# Patient Record
Sex: Female | Born: 1995 | Race: White | Hispanic: No | Smoking: Never smoker
Health system: Southern US, Community
[De-identification: ages and names within clinical notes are randomized; demographics above are authoritative.]

## PROBLEM LIST (undated history)

## (undated) ENCOUNTER — Inpatient Hospital Stay: Payer: Self-pay

## (undated) ENCOUNTER — Ambulatory Visit: Admission: EM | Payer: Commercial Managed Care - PPO

## (undated) DIAGNOSIS — R102 Pelvic and perineal pain: Secondary | ICD-10-CM

## (undated) DIAGNOSIS — E282 Polycystic ovarian syndrome: Secondary | ICD-10-CM

## (undated) DIAGNOSIS — Z8619 Personal history of other infectious and parasitic diseases: Secondary | ICD-10-CM

## (undated) DIAGNOSIS — R635 Abnormal weight gain: Secondary | ICD-10-CM

## (undated) DIAGNOSIS — Z789 Other specified health status: Secondary | ICD-10-CM

## (undated) DIAGNOSIS — A749 Chlamydial infection, unspecified: Secondary | ICD-10-CM

## (undated) DIAGNOSIS — N926 Irregular menstruation, unspecified: Secondary | ICD-10-CM

## (undated) DIAGNOSIS — N944 Primary dysmenorrhea: Secondary | ICD-10-CM

## (undated) DIAGNOSIS — A64 Unspecified sexually transmitted disease: Secondary | ICD-10-CM

## (undated) HISTORY — DX: Chlamydial infection, unspecified: A74.9

## (undated) HISTORY — DX: Abnormal weight gain: R63.5

## (undated) HISTORY — DX: Unspecified sexually transmitted disease: A64

## (undated) HISTORY — DX: Primary dysmenorrhea: N94.4

## (undated) HISTORY — DX: Irregular menstruation, unspecified: N92.6

## (undated) HISTORY — PX: NO PAST SURGERIES: SHX2092

## (undated) HISTORY — DX: Pelvic and perineal pain: R10.2

## (undated) HISTORY — DX: Personal history of other infectious and parasitic diseases: Z86.19

---
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
## 2016-03-07 ENCOUNTER — Other Ambulatory Visit: Payer: Self-pay | Admitting: *Deleted

## 2016-03-07 DIAGNOSIS — Z20828 Contact with and (suspected) exposure to other viral communicable diseases: Secondary | ICD-10-CM

## 2016-03-08 ENCOUNTER — Other Ambulatory Visit: Payer: PRIVATE HEALTH INSURANCE

## 2016-04-03 ENCOUNTER — Other Ambulatory Visit: Payer: Self-pay

## 2016-04-11 ENCOUNTER — Ambulatory Visit: Payer: PRIVATE HEALTH INSURANCE | Admitting: Obstetrics and Gynecology

## 2016-06-26 ENCOUNTER — Other Ambulatory Visit: Payer: Self-pay | Admitting: Obstetrics and Gynecology

## 2016-06-26 MED ORDER — FOLIC ACID 1 MG PO TABS: 1.0000 mg | ORAL_TABLET | Freq: Every day | ORAL | 4 refills | Status: DC

## 2016-10-19 ENCOUNTER — Encounter: Payer: Self-pay | Admitting: Obstetrics and Gynecology

## 2016-12-26 ENCOUNTER — Other Ambulatory Visit: Payer: Self-pay | Admitting: Obstetrics and Gynecology

## 2016-12-26 ENCOUNTER — Encounter: Payer: Self-pay | Admitting: Obstetrics and Gynecology

## 2016-12-26 ENCOUNTER — Ambulatory Visit (INDEPENDENT_AMBULATORY_CARE_PROVIDER_SITE_OTHER): Payer: PRIVATE HEALTH INSURANCE | Admitting: Obstetrics and Gynecology

## 2016-12-26 VITALS — BP 120/78 | HR 98 | Ht 66.0 in | Wt 150.2 lb

## 2016-12-26 DIAGNOSIS — Z01419 Encounter for gynecological examination (general) (routine) without abnormal findings: Secondary | ICD-10-CM | POA: Diagnosis not present

## 2016-12-26 NOTE — Patient Instructions (Signed)
 Preventive Care 18-39 Years, Female Preventive care refers to lifestyle choices and visits with your health care provider that can promote health and wellness. What does preventive care include?  A yearly physical exam. This is also called an annual well check.  Dental exams once or twice a year.  Routine eye exams. Ask your health care provider how often you should have your eyes checked.  Personal lifestyle choices, including:  Daily care of your teeth and gums.  Regular physical activity.  Eating a healthy diet.  Avoiding tobacco and drug use.  Limiting alcohol use.  Practicing safe sex.  Taking vitamin and mineral supplements as recommended by your health care provider. What happens during an annual well check? The services and screenings done by your health care provider during your annual well check will depend on your age, overall health, lifestyle risk factors, and family history of disease. Counseling  Your health care provider may ask you questions about your:  Alcohol use.  Tobacco use.  Drug use.  Emotional well-being.  Home and relationship well-being.  Sexual activity.  Eating habits.  Work and work environment.  Method of birth control.  Menstrual cycle.  Pregnancy history. Screening  You may have the following tests or measurements:  Height, weight, and BMI.  Diabetes screening. This is done by checking your blood sugar (glucose) after you have not eaten for a while (fasting).  Blood pressure.  Lipid and cholesterol levels. These may be checked every 5 years starting at age 20.  Skin check.  Hepatitis C blood test.  Hepatitis B blood test.  Sexually transmitted disease (STD) testing.  BRCA-related cancer screening. This may be done if you have a family history of breast, ovarian, tubal, or peritoneal cancers.  Pelvic exam and Pap test. This may be done every 3 years starting at age 21. Starting at age 30, this may be done  every 5 years if you have a Pap test in combination with an HPV test. Discuss your test results, treatment options, and if necessary, the need for more tests with your health care provider. Vaccines  Your health care provider may recommend certain vaccines, such as:  Influenza vaccine. This is recommended every year.  Tetanus, diphtheria, and acellular pertussis (Tdap, Td) vaccine. You may need a Td booster every 10 years.  Varicella vaccine. You may need this if you have not been vaccinated.  HPV vaccine. If you are 26 or younger, you may need three doses over 6 months.  Measles, mumps, and rubella (MMR) vaccine. You may need at least one dose of MMR. You may also need a second dose.  Pneumococcal 13-valent conjugate (PCV13) vaccine. You may need this if you have certain conditions and were not previously vaccinated.  Pneumococcal polysaccharide (PPSV23) vaccine. You may need one or two doses if you smoke cigarettes or if you have certain conditions.  Meningococcal vaccine. One dose is recommended if you are age 19-21 years and a first-year college student living in a residence hall, or if you have one of several medical conditions. You may also need additional booster doses.  Hepatitis A vaccine. You may need this if you have certain conditions or if you travel or work in places where you may be exposed to hepatitis A.  Hepatitis B vaccine. You may need this if you have certain conditions or if you travel or work in places where you may be exposed to hepatitis B.  Haemophilus influenzae type b (Hib) vaccine. You may need   this if you have certain risk factors. Talk to your health care provider about which screenings and vaccines you need and how often you need them. This information is not intended to replace advice given to you by your health care provider. Make sure you discuss any questions you have with your health care provider. Document Released: 12/04/2001 Document Revised:  06/27/2016 Document Reviewed: 08/09/2015 Elsevier Interactive Patient Education  2017 Elsevier Inc.  

## 2016-12-26 NOTE — Progress Notes (Signed)
   Subjective:     Stephanie May is a 21 y.o. female and is here for a comprehensive physical exam. The patient reports no problems. Single white female. Currently in school full time. In monogamous relationship with female partner.   Social History   Social History  . Marital status: Single    Spouse name: N/A  . Number of children: N/A  . Years of education: N/A   Occupational History  . Not on file.   Social History Main Topics  . Smoking status: Never Smoker  . Smokeless tobacco: Never Used  . Alcohol use No  . Drug use: No  . Sexual activity: Yes    Birth control/ protection: Condom   Other Topics Concern  . Not on file   Social History Narrative   ** Merged History Encounter **       Health Maintenance  Topic Date Due  . Janet BerlinETANUS/TDAP  02/06/2016  . INFLUENZA VACCINE  05/22/2016  . CHLAMYDIA SCREENING  12/05/2016  . HIV Screening  Completed    The following portions of the patient's history were reviewed and updated as appropriate: allergies, current medications, past family history, past medical history, past social history, past surgical history and problem list.  Review of Systems Pertinent items noted in HPI and remainder of comprehensive ROS otherwise negative.   Objective:    General appearance: alert, cooperative and appears stated age Neck: no adenopathy, no carotid bruit, no JVD, supple, symmetrical, trachea midline and thyroid not enlarged, symmetric, no tenderness/mass/nodules Lungs: clear to auscultation bilaterally Breasts: normal appearance, no masses or tenderness Heart: regular rate and rhythm, S1, S2 normal, no murmur, click, rub or gallop Abdomen: soft, non-tender; bowel sounds normal; no masses,  no organomegaly Pelvic: cervix normal in appearance, external genitalia normal, no adnexal masses or tenderness, no cervical motion tenderness, rectovaginal septum normal, uterus normal size, shape, and consistency and vagina normal without  discharge    Assessment:    Healthy female exam.      Plan:  RTC 1 year.  Melody Aura CampsShambley, CNM   See After Visit Summary for Counseling Recommendations

## 2017-03-08 ENCOUNTER — Other Ambulatory Visit: Payer: PRIVATE HEALTH INSURANCE

## 2017-03-08 ENCOUNTER — Other Ambulatory Visit: Payer: Self-pay | Admitting: Obstetrics and Gynecology

## 2017-03-08 DIAGNOSIS — N926 Irregular menstruation, unspecified: Secondary | ICD-10-CM

## 2017-03-08 LAB — BETA HCG QUANT (REF LAB): hCG Quant: 1798 m[IU]/mL

## 2017-03-14 ENCOUNTER — Other Ambulatory Visit: Payer: Self-pay | Admitting: Obstetrics and Gynecology

## 2017-03-14 ENCOUNTER — Encounter: Payer: Self-pay | Admitting: Obstetrics and Gynecology

## 2017-03-14 ENCOUNTER — Ambulatory Visit (INDEPENDENT_AMBULATORY_CARE_PROVIDER_SITE_OTHER): Payer: PRIVATE HEALTH INSURANCE

## 2017-03-14 DIAGNOSIS — N926 Irregular menstruation, unspecified: Secondary | ICD-10-CM | POA: Diagnosis not present

## 2017-03-23 ENCOUNTER — Encounter: Payer: Self-pay | Admitting: Obstetrics and Gynecology

## 2017-03-29 ENCOUNTER — Ambulatory Visit (INDEPENDENT_AMBULATORY_CARE_PROVIDER_SITE_OTHER): Payer: PRIVATE HEALTH INSURANCE | Admitting: Obstetrics and Gynecology

## 2017-03-29 VITALS — BP 112/60 | HR 64 | Ht 66.0 in | Wt 146.4 lb

## 2017-03-29 DIAGNOSIS — Z3401 Encounter for supervision of normal first pregnancy, first trimester: Secondary | ICD-10-CM

## 2017-03-29 DIAGNOSIS — Z113 Encounter for screening for infections with a predominantly sexual mode of transmission: Secondary | ICD-10-CM

## 2017-03-29 DIAGNOSIS — Z1389 Encounter for screening for other disorder: Secondary | ICD-10-CM

## 2017-03-29 NOTE — Patient Instructions (Signed)
Pregnancy and Zika Virus Disease Zika virus disease, or Zika, is an illness that can spread to people from mosquitoes that carry the virus. It may also spread from person to person through infected body fluids. Zika first occurred in [place], but recently it has spread to new areas. The virus occurs in tropical climates. The location of Zika continues to change. Most people who become infected with Zika virus do not develop serious illness. However, Zika may cause birth defects in an unborn baby whose mother is infected with the virus. It may also increase the risk of miscarriage. What are the symptoms of Zika virus disease? In many cases, people who have been infected with Zika virus do not develop any symptoms. If symptoms appear, they usually start about a week after the person is infected. Symptoms are usually mild. They may include:  Fever.  Rash.  Red eyes.  Joint pain.  How does Zika virus disease spread? The main way that Zika virus spreads is through the bite of a certain type of mosquito. Unlike most types of mosquitos, which bite only at night, the type of mosquito that carries Zika virus bites both at night and during the day. Zika virus can also spread through sexual contact, through a blood transfusion, and from a mother to her baby before or during birth. Once you have had Zika virus disease, it is unlikely that you will get it again. Can I pass Zika to my baby during pregnancy? Yes, Zika can pass from a mother to her baby before or during birth. What problems can Zika cause for my baby? A woman who is infected with Zika virus while pregnant is at risk of having her baby born with a condition in which the brain or head is smaller than expected (microcephaly). Babies who have microcephaly can have developmental delays, seizures, hearing problems, and vision problems. Having Zika virus disease during pregnancy can also increase the risk of miscarriage. How can Zika virus disease be  prevented? There is no vaccine to prevent Zika. The best way to prevent the disease is to avoid infected mosquitoes and avoid exposure to body fluids that can spread the virus. Avoid any possible exposure to Zika by taking the following precautions. For women and their sex partners:  Avoid traveling to high-risk areas. The locations where Zika is being reported change often. To identify high-risk areas, check the CDC travel website: www.cdc.gov/zika/geo/index.html  If you or your sex partner must travel to a high-risk area, talk with a health care provider before and after traveling.  Take all precautions to avoid mosquito bites if you live in, or travel to, any of the high-risk areas. Insect repellents are safe to use during pregnancy.  Ask your health care provider when it is safe to have sexual contact.  For women:  If you are pregnant or trying to become pregnant, avoid sexual contact with persons who may have been exposed to Zika virus, persons who have possible symptoms of Zika, or persons whose history you are unsure about. If you choose to have sexual contact with someone who may have been exposed to Zika virus, use condoms correctly during the entire duration of sexual activity, every time. Do not share sexual devices, as you may be exposed to body fluids.  Ask your health care provider about when it is safe to attempt pregnancy after a possible exposure to Zika virus.  What steps should I take to avoid mosquito bites? Take these steps to avoid mosquito bites   when you are in a high-risk area:  Wear loose clothing that covers your arms and legs.  Limit your outdoor activities.  Do not open windows unless they have window screens.  Sleep under mosquito nets.  Use insect repellent. The best insect repellents have:  DEET, picaridin, oil of lemon eucalyptus (OLE), or IR3535 in them.  Higher amounts of an active ingredient in them.  Remember that insect repellents are safe to  use during pregnancy.  Do not use OLE on children who are younger than 3 years of age. Do not use insect repellent on babies who are younger than 2 months of age.  Cover your child's stroller with mosquito netting. Make sure the netting fits snugly and that any loose netting does not cover your child's mouth or nose. Do not use a blanket as a mosquito-protection cover.  Do not apply insect repellent underneath clothing.  If you are using sunscreen, apply the sunscreen before applying the insect repellent.  Treat clothing with permethrin. Do not apply permethrin directly to your skin. Follow label directions for safe use.  Get rid of standing water, where mosquitoes may reproduce. Standing water is often found in items such as buckets, bowls, animal food dishes, and flowerpots.  When you return from traveling to any high-risk area, continue taking actions to protect yourself against mosquito bites for 3 weeks, even if you show no signs of illness. This will prevent spreading Zika virus to uninfected mosquitoes. What should I know about the sexual transmission of Zika? People can spread Zika to their sexual partners during vaginal, anal, or oral sex, or by sharing sexual devices. Many people with Zika do not develop symptoms, so a person could spread the disease without knowing that they are infected. The greatest risk is to women who are pregnant or who may become pregnant. Zika virus can live longer in semen than it can live in blood. Couples can prevent sexual transmission of the virus by:  Using condoms correctly during the entire duration of sexual activity, every time. This includes vaginal, anal, and oral sex.  Not sharing sexual devices. Sharing increases your risk of being exposed to body fluid from another person.  Avoiding all sexual activity until your health care provider says it is safe.  Should I be tested for Zika virus? A sample of your blood can be tested for Zika virus. A  pregnant woman should be tested if she may have been exposed to the virus or if she has symptoms of Zika. She may also have additional tests done during her pregnancy, such ultrasound testing. Talk with your health care provider about which tests are recommended. This information is not intended to replace advice given to you by your health care provider. Make sure you discuss any questions you have with your health care provider. Document Released: 06/29/2015 Document Revised: 03/15/2016 Document Reviewed: 06/22/2015 Elsevier Interactive Patient Education  2018 [employer] Hyperemesis Gravidarum Hyperemesis gravidarum is a severe form of nausea and vomiting that happens during pregnancy. Hyperemesis is worse than morning sickness. It may cause you to have nausea or vomiting all day for many days. It may keep you from eating and drinking enough food and liquids. Hyperemesis usually occurs during the first half (the first 20 weeks) of pregnancy. It often goes away once a woman is in her second half of pregnancy. However, sometimes hyperemesis continues through an entire pregnancy. What are the causes? The cause of this condition is not known. It may be related   to changes in chemicals (hormones) in the body during pregnancy, such as the high level of pregnancy hormone (human chorionic gonadotropin) or the increase in the female sex hormone (estrogen). What are the signs or symptoms? Symptoms of this condition include:  Severe nausea and vomiting.  Nausea that does not go away.  Vomiting that does not allow you to keep any food down.  Weight loss.  Body fluid loss (dehydration).  Having no desire to eat, or not liking food that you have previously enjoyed.  How is this diagnosed? This condition may be diagnosed based on:  A physical exam.  Your medical history.  Your symptoms.  Blood tests.  Urine tests.  How is this treated? This condition may be managed with medicine. If  medicines to do not help relieve nausea and vomiting, you may need to receive fluids through an IV tube at the hospital. Follow these instructions at home:  Take over-the-counter and prescription medicines only as told by your health care provider.  Avoid iron pills and multivitamins that contain iron for the first 3-4 months of pregnancy. If you take prescription iron pills, do not stop taking them unless your health care provider approves.  Take the following actions to help prevent nausea and vomiting: ? In the morning, before getting out of bed, try eating a couple of dry crackers or a piece of toast. ? Avoid foods and smells that upset your stomach. Fatty and spicy foods may make nausea worse. ? Eat 5-6 small meals a day. ? Do not drink fluids while eating meals. Drink between meals. ? Eat or suck on things that have ginger in them. Ginger can help relieve nausea. ? Avoid food preparation. The smell of food can spoil your appetite or trigger nausea.  Follow instructions from your health care provider about eating or drinking restrictions.  For snacks, eat high-protein foods, such as cheese.  Keep all follow-up and pre-birth (prenatal) visits as told by your health care provider. This is important. Contact a health care provider if:  You have pain in your abdomen.  You have a severe headache.  You have vision problems.  You are losing weight. Get help right away if:  You cannot drink fluids without vomiting.  You vomit blood.  You have constant nausea and vomiting.  You are very weak.  You are very thirsty.  You feel dizzy.  You faint.  You have a fever or other symptoms that last for more than 2-3 days.  You have a fever and your symptoms suddenly get worse. Summary  Hyperemesis gravidarum is a severe form of nausea and vomiting that happens during pregnancy.  Making some changes to your eating habits may help relieve nausea and vomiting.  This condition may  be managed with medicine.  If medicines to do not help relieve nausea and vomiting, you may need to receive fluids through an IV tube at the hospital. This information is not intended to replace advice given to you by your health care provider. Make sure you discuss any questions you have with your health care provider. Document Released: 10/08/2005 Document Revised: 06/06/2016 Document Reviewed: 06/06/2016 Elsevier Interactive Patient Education  2017 [employer] First Trimester of Pregnancy The first trimester of pregnancy is from week 1 until the end of week 13 (months 1 through 3). During this time, your baby will begin to develop inside you. At 6-8 weeks, the eyes and face are formed, and the heartbeat can be seen on ultrasound. At the   end of 12 weeks, all the baby's organs are formed. Prenatal care is all the medical care you receive before the birth of your baby. Make sure you get good prenatal care and follow all of your doctor's instructions. Follow these instructions at home: Medicines  Take over-the-counter and prescription medicines only as told by your doctor. Some medicines are safe and some medicines are not safe during pregnancy.  Take a prenatal vitamin that contains at least 600 micrograms (mcg) of folic acid.  If you have trouble pooping (constipation), take medicine that will make your stool soft (stool softener) if your doctor approves. Eating and drinking  Eat regular, healthy meals.  Your doctor will tell you the amount of weight gain that is right for you.  Avoid raw meat and uncooked cheese.  If you feel sick to your stomach (nauseous) or throw up (vomit): ? Eat 4 or 5 small meals a day instead of 3 large meals. ? Try eating a few soda crackers. ? Drink liquids between meals instead of during meals.  To prevent constipation: ? Eat foods that are high in fiber, like fresh fruits and vegetables, whole grains, and beans. ? Drink enough fluids to keep your pee  (urine) clear or pale yellow. Activity  Exercise only as told by your doctor. Stop exercising if you have cramps or pain in your lower belly (abdomen) or low back.  Do not exercise if it is too hot, too humid, or if you are in a place of great height (high altitude).  Try to avoid standing for long periods of time. Move your legs often if you must stand in one place for a long time.  Avoid heavy lifting.  Wear low-heeled shoes. Sit and stand up straight.  You can have sex unless your doctor tells you not to. Relieving pain and discomfort  Wear a good support bra if your breasts are sore.  Take warm water baths (sitz baths) to soothe pain or discomfort caused by hemorrhoids. Use hemorrhoid cream if your doctor says it is okay.  Rest with your legs raised if you have leg cramps or low back pain.  If you have puffy, bulging veins (varicose veins) in your legs: ? Wear support hose or compression stockings as told by your doctor. ? Raise (elevate) your feet for 15 minutes, 3-4 times a day. ? Limit salt in your food. Prenatal care  Schedule your prenatal visits by the twelfth week of pregnancy.  Write down your questions. Take them to your prenatal visits.  Keep all your prenatal visits as told by your doctor. This is important. Safety  Wear your seat belt at all times when driving.  Make a list of emergency phone numbers. The list should include numbers for family, friends, the hospital, and police and fire departments. General instructions  Ask your doctor for a referral to a local prenatal class. Begin classes no later than at the start of month 6 of your pregnancy.  Ask for help if you need counseling or if you need help with nutrition. Your doctor can give you advice or tell you where to go for help.  Do not use hot tubs, steam rooms, or saunas.  Do not douche or use tampons or scented sanitary pads.  Do not cross your legs for long periods of time.  Avoid all herbs  and alcohol. Avoid drugs that are not approved by your doctor.  Do not use any tobacco products, including cigarettes, chewing tobacco, and electronic cigarettes.   If you need help quitting, ask your doctor. You may get counseling or other support to help you quit.  Avoid cat litter boxes and soil used by cats. These carry germs that can cause birth defects in the baby and can cause a loss of your baby (miscarriage) or stillbirth.  Visit your dentist. At home, brush your teeth with a soft toothbrush. Be gentle when you floss. Contact a doctor if:  You are dizzy.  You have mild cramps or pressure in your lower belly.  You have a nagging pain in your belly area.  You continue to feel sick to your stomach, you throw up, or you have watery poop (diarrhea).  You have a bad smelling fluid coming from your vagina.  You have pain when you pee (urinate).  You have increased puffiness (swelling) in your face, hands, legs, or ankles. Get help right away if:  You have a fever.  You are leaking fluid from your vagina.  You have spotting or bleeding from your vagina.  You have very bad belly cramping or pain.  You gain or lose weight rapidly.  You throw up blood. It may look like coffee grounds.  You are around people who have German measles, fifth disease, or chickenpox.  You have a very bad headache.  You have shortness of breath.  You have any kind of trauma, such as from a fall or a car accident. Summary  The first trimester of pregnancy is from week 1 until the end of week 13 (months 1 through 3).  To take care of yourself and your unborn baby, you will need to eat healthy meals, take medicines only if your doctor tells you to do so, and do activities that are safe for you and your baby.  Keep all follow-up visits as told by your doctor. This is important as your doctor will have to ensure that your baby is healthy and growing well. This information is not intended to replace  advice given to you by your health care provider. Make sure you discuss any questions you have with your health care provider. Document Released: 03/26/2008 Document Revised: 10/16/2016 Document Reviewed: 10/16/2016 Elsevier Interactive Patient Education  2017 [employer] Commonly Asked Questions During Pregnancy  Cats: A parasite can be excreted in cat feces.  To avoid exposure you need to have another person empty the little box.  If you must empty the litter box you will need to wear gloves.  Wash your hands after handling your cat.  This parasite can also be found in raw or undercooked meat so this should also be avoided.  Colds, Sore Throats, Flu: Please check your medication sheet to see what you can take for symptoms.  If your symptoms are unrelieved by these medications please call the office.  Dental Work: Most any dental work your dentist recommends is permitted.  X-rays should only be taken during the first trimester if absolutely necessary.  Your abdomen should be shielded with a lead apron during all x-rays.  Please notify your provider prior to receiving any x-rays.  Novocaine is fine; gas is not recommended.  If your dentist requires a note from us prior to dental work please call the office and we will provide one for you.  Exercise: Exercise is an important part of staying healthy during your pregnancy.  You may continue most exercises you were accustomed to prior to pregnancy.  Later in your pregnancy you will most likely notice you have difficulty with activities   requiring balance like riding a bicycle.  It is important that you listen to your body and avoid activities that put you at a higher risk of falling.  Adequate rest and staying well hydrated are a must!  If you have questions about the safety of specific activities ask your provider.    Exposure to Children with illness: Try to avoid obvious exposure; report any symptoms to us when noted,  If you have chicken pos, red  measles or mumps, you should be immune to these diseases.   Please do not take any vaccines while pregnant unless you have checked with your OB provider.  Fetal Movement: After 28 weeks we recommend you do "kick counts" twice daily.  Lie or sit down in a calm quiet environment and count your baby movements "kicks".  You should feel your baby at least 10 times per hour.  If you have not felt 10 kicks within the first hour get up, walk around and have something sweet to eat or drink then repeat for an additional hour.  If count remains less than 10 per hour notify your provider.  Fumigating: Follow your pest control agent's advice as to how long to stay out of your home.  Ventilate the area well before re-entering.  Hemorrhoids:   Most over-the-counter preparations can be used during pregnancy.  Check your medication to see what is safe to use.  It is important to use a stool softener or fiber in your diet and to drink lots of liquids.  If hemorrhoids seem to be getting worse please call the office.   Hot Tubs:  Hot tubs Jacuzzis and saunas are not recommended while pregnant.  These increase your internal body temperature and should be avoided.  Intercourse:  Sexual intercourse is safe during pregnancy as long as you are comfortable, unless otherwise advised by your provider.  Spotting may occur after intercourse; report any bright red bleeding that is heavier than spotting.  Labor:  If you know that you are in labor, please go to the hospital.  If you are unsure, please call the office and let us help you decide what to do.  Lifting, straining, etc:  If your job requires heavy lifting or straining please check with your provider for any limitations.  Generally, you should not lift items heavier than that you can lift simply with your hands and arms (no back muscles)  Painting:  Paint fumes do not harm your pregnancy, but may make you ill and should be avoided if possible.  Latex or water based paints  have less odor than oils.  Use adequate ventilation while painting.  Permanents & Hair Color:  Chemicals in hair dyes are not recommended as they cause increase hair dryness which can increase hair loss during pregnancy.  " Highlighting" and permanents are allowed.  Dye may be absorbed differently and permanents may not hold as well during pregnancy.  Sunbathing:  Use a sunscreen, as skin burns easily during pregnancy.  Drink plenty of fluids; avoid over heating.  Tanning Beds:  Because their possible side effects are still unknown, tanning beds are not recommended.  Ultrasound Scans:  Routine ultrasounds are performed at approximately 20 weeks.  You will be able to see your baby's general anatomy an if you would like to know the gender this can usually be determined as well.  If it is questionable when you conceived you may also receive an ultrasound early in your pregnancy for dating purposes.  Otherwise ultrasound exams   are not routinely performed unless there is a medical necessity.  Although you can request a scan we ask that you pay for it when conducted because insurance does not cover " patient request" scans.  Work: If your pregnancy proceeds without complications you may work until your due date, unless your physician or employer advises otherwise.  Round Ligament Pain/Pelvic Discomfort:  Sharp, shooting pains not associated with bleeding are fairly common, usually occurring in the second trimester of pregnancy.  They tend to be worse when standing up or when you remain standing for long periods of time.  These are the result of pressure of certain pelvic ligaments called "round ligaments".  Rest, Tylenol and heat seem to be the most effective relief.  As the womb and fetus grow, they rise out of the pelvis and the discomfort improves.  Please notify the office if your pain seems different than that described.  It may represent a more serious condition.   

## 2017-03-29 NOTE — Progress Notes (Signed)
Jacqulynn Cadetaroline M Casas presents for NOB nurse interview visit. Pregnancy confirmation done by [facility]. Ultrasound done 03/14/2017 with EDD: 11/06/2016 which is not consistent with LMP: 01/22/2017 (+1wk 1day difference). G-1.  P-0. Pregnancy education material explained and given. No cats in the home. NOB labs ordered. HIV labs and Drug screen were explained optional and she did not decline. Drug screen ordered. PNV encouraged. Genetic screening options discussed. Genetic testing: Unsure.  Pt may discuss with provider. Pt. To follow up with provider as scheduled on 04/26/2017  for NOB physical. Pt getting married after NOB physical and wanted to know if she could get a spray tan (yes) and if she could bleach her teeth (yes, per JML) but do not swallow it.  All questions answered.

## 2017-03-30 LAB — CBC WITH DIFFERENTIAL/PLATELET
Basophils Absolute: 0 10*3/uL (ref 0.0–0.2)
Basos: 0 %
EOS (ABSOLUTE): 0 10*3/uL (ref 0.0–0.4)
Eos: 0 %
Hematocrit: 43 % (ref 34.0–46.6)
Hemoglobin: 14.2 g/dL (ref 11.1–15.9)
Immature Grans (Abs): 0 10*3/uL (ref 0.0–0.1)
Immature Granulocytes: 0 %
Lymphocytes Absolute: 1.7 10*3/uL (ref 0.7–3.1)
Lymphs: 25 %
MCH: 29.8 pg (ref 26.6–33.0)
MCHC: 33 g/dL (ref 31.5–35.7)
MCV: 90 fL (ref 79–97)
Monocytes Absolute: 0.4 10*3/uL (ref 0.1–0.9)
Monocytes: 6 %
Neutrophils Absolute: 4.8 10*3/uL (ref 1.4–7.0)
Neutrophils: 69 %
Platelets: 208 10*3/uL (ref 150–379)
RBC: 4.77 x10E6/uL (ref 3.77–5.28)
RDW: 12.9 % (ref 12.3–15.4)
WBC: 6.9 10*3/uL (ref 3.4–10.8)

## 2017-03-30 LAB — HEPATITIS B SURFACE ANTIGEN: Hepatitis B Surface Ag: NEGATIVE

## 2017-03-30 LAB — HIV ANTIBODY (ROUTINE TESTING W REFLEX): HIV Screen 4th Generation wRfx: NONREACTIVE

## 2017-03-30 LAB — RH TYPE: Rh Factor: POSITIVE

## 2017-03-30 LAB — RUBELLA SCREEN: Rubella Antibodies, IGG: 2.3 index (ref >0.99)

## 2017-03-30 LAB — ABO

## 2017-03-30 LAB — RPR: RPR Ser Ql: NONREACTIVE

## 2017-03-30 LAB — ANTIBODY SCREEN: Antibody Screen: NEGATIVE

## 2017-03-30 LAB — VARICELLA ZOSTER ANTIBODY, IGG: Varicella zoster IgG: <135 index — ABNORMAL LOW (ref >165)

## 2017-03-31 LAB — GC/CHLAMYDIA PROBE AMP
Chlamydia trachomatis, NAA: NEGATIVE
Neisseria gonorrhoeae by PCR: NEGATIVE

## 2017-03-31 LAB — URINE CULTURE, OB REFLEX

## 2017-03-31 LAB — CULTURE, OB URINE

## 2017-04-01 LAB — URINALYSIS, ROUTINE W REFLEX MICROSCOPIC
Bilirubin, UA: NEGATIVE
Glucose, UA: NEGATIVE
Ketones, UA: NEGATIVE
Leukocytes, UA: NEGATIVE
Nitrite, UA: NEGATIVE
Protein, UA: NEGATIVE
RBC, UA: NEGATIVE
Specific Gravity, UA: 1.022 (ref 1.005–1.030)
Urobilinogen, Ur: 0.2 mg/dL (ref 0.2–1.0)
pH, UA: 5.5 (ref 5.0–7.5)

## 2017-04-01 LAB — NICOTINE SCREEN, URINE: Cotinine Ql Scrn, Ur: NEGATIVE ng/mL

## 2017-04-01 LAB — MONITOR DRUG PROFILE 14(MW)
Amphetamine Scrn, Ur: NEGATIVE ng/mL
BARBITURATE SCREEN URINE: NEGATIVE ng/mL
BENZODIAZEPINE SCREEN, URINE: NEGATIVE ng/mL
Buprenorphine, Urine: NEGATIVE ng/mL
CANNABINOIDS UR QL SCN: NEGATIVE ng/mL
Cocaine (Metab) Scrn, Ur: NEGATIVE ng/mL
Creatinine(Crt), U: 139.3 mg/dL (ref 20.0–300.0)
Fentanyl, Urine: NEGATIVE pg/mL
Meperidine Screen, Urine: NEGATIVE ng/mL
Methadone Screen, Urine: NEGATIVE ng/mL
OXYCODONE+OXYMORPHONE UR QL SCN: NEGATIVE ng/mL
Opiate Scrn, Ur: NEGATIVE ng/mL
Ph of Urine: 5.5 (ref 4.5–8.9)
Phencyclidine Qn, Ur: NEGATIVE ng/mL
Propoxyphene Scrn, Ur: NEGATIVE ng/mL
SPECIFIC GRAVITY: 1.017
Tramadol Screen, Urine: NEGATIVE ng/mL

## 2017-04-02 ENCOUNTER — Other Ambulatory Visit: Payer: Self-pay | Admitting: Obstetrics and Gynecology

## 2017-04-02 ENCOUNTER — Encounter: Payer: Self-pay | Admitting: Obstetrics and Gynecology

## 2017-04-02 DIAGNOSIS — Z283 Underimmunization status: Principal | ICD-10-CM

## 2017-04-02 DIAGNOSIS — O09899 Supervision of other high risk pregnancies, unspecified trimester: Secondary | ICD-10-CM

## 2017-04-02 DIAGNOSIS — Z2839 Other underimmunization status: Secondary | ICD-10-CM | POA: Insufficient documentation

## 2017-04-03 ENCOUNTER — Encounter: Payer: Self-pay | Admitting: Obstetrics and Gynecology

## 2017-04-04 ENCOUNTER — Encounter: Payer: Self-pay | Admitting: Obstetrics and Gynecology

## 2017-04-12 ENCOUNTER — Encounter: Payer: Self-pay | Admitting: Obstetrics and Gynecology

## 2017-04-26 ENCOUNTER — Ambulatory Visit (INDEPENDENT_AMBULATORY_CARE_PROVIDER_SITE_OTHER): Payer: PRIVATE HEALTH INSURANCE | Admitting: Obstetrics and Gynecology

## 2017-04-26 VITALS — BP 118/69 | HR 61 | Wt 146.6 lb

## 2017-04-26 DIAGNOSIS — Z3492 Encounter for supervision of normal pregnancy, unspecified, second trimester: Secondary | ICD-10-CM

## 2017-04-26 NOTE — Patient Instructions (Signed)

## 2017-04-26 NOTE — Progress Notes (Signed)
NEW OB HISTORY AND PHYSICAL  SUBJECTIVE:       Stephanie May is a 21 y.o. G1P0 female, Patient's last menstrual period was 01/22/2017 (exact date)., Estimated Date of Delivery: 11/06/17, [id number], presents today for establishment of Prenatal Care. She has no unusual complaints and complains of nausea improving the last week.      Gynecologic History Patient's last menstrual period was 01/22/2017 (exact date). Normal Contraception: none Last Pap: NA.   Obstetric History OB History  Gravida Para Term Preterm AB Living  1            SAB TAB Ectopic Multiple Live Births               # Outcome Date GA Lbr Len/2nd Weight Sex Delivery Anes PTL Lv  1 Current               Past Medical History:  Diagnosis Date  . Chlamydia   . History of chlamydia infection 2 9 16   . Irregular menses   . Pelvic pain in female   . Primary dysmenorrhea   . Sexually transmitted disease (STD)    previous h/o  . Unexplained weight gain     Past Surgical History:  Procedure Laterality Date  . NO PAST SURGERIES      Current Outpatient Prescriptions on File Prior to Visit  Medication Sig Dispense Refill  . folic acid (FOLVITE) 1 MG tablet Take 1 tablet (1 mg total) by mouth daily. (Patient not taking: Reported on 04/26/2017) 90 tablet 4  . Inositol 500 MG TABS Take 1 tablet (500 mg total) by mouth daily. (Patient not taking: Reported on 04/26/2017) 100 each 6   No current facility-administered medications on file prior to visit.     No Known Allergies  Social History   Social History  . Marital status: Single    Spouse name: N/A  . Number of children: N/A  . Years of education: N/A   Occupational History  . Not on file.   Social History Main Topics  . Smoking status: Never Smoker  . Smokeless tobacco: Never Used  . Alcohol use No  . Drug use: No  . Sexual activity: Yes    Partners: Male   Other Topics Concern  . Not on file   Social History Narrative   ** Merged History  Encounter **        Family History  Problem Relation Age of Onset  . Diabetes Maternal Grandfather     The following portions of the patient's history were reviewed and updated as appropriate: allergies, current medications, past OB history, past medical history, past surgical history, past family history, past social history, and problem list.    OBJECTIVE: Initial Physical Exam (New OB)  GENERAL APPEARANCE: alert, well appearing, in no apparent distress, oriented to person, place and time HEAD: normocephalic, atraumatic MOUTH: mucous membranes moist, pharynx normal without lesions and dental hygiene good THYROID: no thyromegaly or masses present BREASTS: not examined LUNGS: clear to auscultation, no wheezes, rales or rhonchi, symmetric air entry HEART: regular rate and rhythm, no murmurs ABDOMEN: soft, nontender, nondistended, no abnormal masses, no epigastric pain, fundus not palpable and FHT present EXTREMITIES: no redness or tenderness in the calves or thighs SKIN: normal coloration and turgor, no rashes LYMPH NODES: no adenopathy palpable NEUROLOGIC: alert, oriented, normal speech, no focal findings or movement disorder noted  PELVIC EXAM not indicated  ASSESSMENT: Normal pregnancy  PLAN: Prenatal care See orders

## 2017-04-26 NOTE — Progress Notes (Signed)
NOB physical- pt is doing well, fatigue, wants to discuss genetic testing

## 2017-04-29 ENCOUNTER — Other Ambulatory Visit: Payer: PRIVATE HEALTH INSURANCE

## 2017-04-29 ENCOUNTER — Encounter: Payer: Self-pay | Admitting: Obstetrics and Gynecology

## 2017-05-01 ENCOUNTER — Encounter: Payer: PRIVATE HEALTH INSURANCE | Admitting: Obstetrics and Gynecology

## 2017-05-07 ENCOUNTER — Encounter: Payer: Self-pay | Admitting: Obstetrics and Gynecology

## 2017-05-24 ENCOUNTER — Ambulatory Visit (INDEPENDENT_AMBULATORY_CARE_PROVIDER_SITE_OTHER): Payer: PRIVATE HEALTH INSURANCE | Admitting: Obstetrics and Gynecology

## 2017-05-24 VITALS — BP 111/72 | HR 66 | Wt 150.4 lb

## 2017-05-24 DIAGNOSIS — Z3492 Encounter for supervision of normal pregnancy, unspecified, second trimester: Secondary | ICD-10-CM

## 2017-05-24 LAB — POCT URINALYSIS DIPSTICK
Bilirubin, UA: NEGATIVE
Blood, UA: NEGATIVE
Glucose, UA: NEGATIVE
Ketones, UA: NEGATIVE
Leukocytes, UA: NEGATIVE
Nitrite, UA: NEGATIVE
Protein, UA: NEGATIVE
Spec Grav, UA: 1.01 (ref 1.010–1.025)
Urobilinogen, UA: 0.2 E.U./dL
pH, UA: 6 (ref 5.0–8.0)

## 2017-05-24 NOTE — Progress Notes (Signed)
ROB- pt is doing well 

## 2017-05-24 NOTE — Progress Notes (Signed)
ROB- reviewed CF lab- spouse will return to lab draw,anatomy scan next visit.morning epigastric pain and nausea- will try diclegis.

## 2017-06-05 ENCOUNTER — Encounter: Payer: Self-pay | Admitting: Obstetrics and Gynecology

## 2017-06-06 ENCOUNTER — Encounter: Payer: Self-pay | Admitting: Obstetrics and Gynecology

## 2017-06-07 ENCOUNTER — Encounter: Payer: Self-pay | Admitting: Obstetrics and Gynecology

## 2017-06-13 ENCOUNTER — Encounter: Payer: Self-pay | Admitting: Obstetrics and Gynecology

## 2017-06-20 ENCOUNTER — Ambulatory Visit (INDEPENDENT_AMBULATORY_CARE_PROVIDER_SITE_OTHER): Payer: BLUE CROSS/BLUE SHIELD

## 2017-06-20 ENCOUNTER — Ambulatory Visit (INDEPENDENT_AMBULATORY_CARE_PROVIDER_SITE_OTHER): Payer: BLUE CROSS/BLUE SHIELD | Admitting: Obstetrics and Gynecology

## 2017-06-20 DIAGNOSIS — Z3492 Encounter for supervision of normal pregnancy, unspecified, second trimester: Secondary | ICD-10-CM

## 2017-06-20 DIAGNOSIS — Z3402 Encounter for supervision of normal first pregnancy, second trimester: Secondary | ICD-10-CM

## 2017-06-20 NOTE — Progress Notes (Signed)
Here for anatomy scan:  Indications:Anatomy U/S Findings:  Singleton intrauterine pregnancy is visualized with FHR at 137 BPM. Biometrics give an (U/S) Gestational age of 20 3/7 weeks and an (U/S) EDD of 11/11/17; this correlates with the clinically established EDD of 11/06/17.  Fetal presentation is Vertex.  EFW: 292g (10oz). Placenta: Posterior, grade 0, 4.2 cm from internal os.. AFI: Adequate with MVP of 4.7 cm.  Anatomic survey is incomplete and normal; Gender - female  .  Anatomy needed to complete anatomy includes: Falx, Lateral vents, CSP/Thalamus, choroid plexus, cerebellum and cisterna magna.  Head anatomy is suboptimal due to fetal position.  Ovaries are not seen. Survey of the adnexa demonstrates no adnexal masses. There is no free peritoneal fluid in the cul de sac.  Impression: 1. 19 3/7 week Viable Singleton Intrauterine pregnancy by U/S. 2. (U/S) EDD is consistent with Clinically established (LMP) EDD of 11/06/17. 3. Incomlete Anatomy Scan  Will see at follow up visit.  Melody St. JamesShambley, CNM

## 2017-06-21 ENCOUNTER — Other Ambulatory Visit: Payer: PRIVATE HEALTH INSURANCE

## 2017-06-21 ENCOUNTER — Encounter: Payer: PRIVATE HEALTH INSURANCE | Admitting: Obstetrics and Gynecology

## 2017-06-28 ENCOUNTER — Other Ambulatory Visit: Payer: Self-pay | Admitting: Obstetrics and Gynecology

## 2017-06-28 DIAGNOSIS — Z0489 Encounter for examination and observation for other specified reasons: Secondary | ICD-10-CM

## 2017-06-28 DIAGNOSIS — IMO0002 Reserved for concepts with insufficient information to code with codable children: Secondary | ICD-10-CM

## 2017-07-01 ENCOUNTER — Ambulatory Visit (INDEPENDENT_AMBULATORY_CARE_PROVIDER_SITE_OTHER): Payer: BLUE CROSS/BLUE SHIELD

## 2017-07-01 DIAGNOSIS — Z048 Encounter for examination and observation for other specified reasons: Secondary | ICD-10-CM | POA: Diagnosis not present

## 2017-07-01 DIAGNOSIS — IMO0002 Reserved for concepts with insufficient information to code with codable children: Secondary | ICD-10-CM

## 2017-07-01 DIAGNOSIS — Z0489 Encounter for examination and observation for other specified reasons: Secondary | ICD-10-CM

## 2017-07-12 ENCOUNTER — Other Ambulatory Visit: Payer: Self-pay | Admitting: *Deleted

## 2017-07-12 MED ORDER — DOXYLAMINE-PYRIDOXINE ER 20-20 MG PO TBCR: 1.0000 | EXTENDED_RELEASE_TABLET | Freq: Two times a day (BID) | ORAL | 1 refills | Status: DC

## 2017-07-15 ENCOUNTER — Encounter: Payer: BLUE CROSS/BLUE SHIELD | Admitting: Certified Nurse Midwife

## 2017-07-16 ENCOUNTER — Encounter: Payer: Self-pay | Admitting: Certified Nurse Midwife

## 2017-07-16 ENCOUNTER — Ambulatory Visit (INDEPENDENT_AMBULATORY_CARE_PROVIDER_SITE_OTHER): Payer: BLUE CROSS/BLUE SHIELD | Admitting: Certified Nurse Midwife

## 2017-07-16 VITALS — BP 107/58 | HR 58 | Wt 159.3 lb

## 2017-07-16 DIAGNOSIS — Z3402 Encounter for supervision of normal first pregnancy, second trimester: Secondary | ICD-10-CM

## 2017-07-16 DIAGNOSIS — Z131 Encounter for screening for diabetes mellitus: Secondary | ICD-10-CM

## 2017-07-16 DIAGNOSIS — Z130 Encounter for screening for diseases of the blood and blood-forming organs and certain disorders involving the immune mechanism: Secondary | ICD-10-CM

## 2017-07-16 LAB — POCT URINALYSIS DIPSTICK
Bilirubin, UA: NEGATIVE
Blood, UA: NEGATIVE
Glucose, UA: NEGATIVE
Ketones, UA: NEGATIVE
Leukocytes, UA: NEGATIVE
Nitrite, UA: NEGATIVE
Protein, UA: NEGATIVE
Spec Grav, UA: 1.015 (ref 1.010–1.025)
Urobilinogen, UA: 0.2 E.U./dL
pH, UA: 7.5 (ref 5.0–8.0)

## 2017-07-16 NOTE — Progress Notes (Signed)
ROB, doing well. She complains of round ligament pain. Discussed use of belly band and tylenol. Reviewed common discomforts of pregnancy. She also complains of bladder pressure and burning. Urine sent for culture. Discussed 1 hr GTT, TDap, CBC, and BTC at next visit. She verbalizes understanding. Follow up 4 wks.    Doreene Burke, CNM

## 2017-07-16 NOTE — Patient Instructions (Signed)
Glucose Tolerance Test During Pregnancy The glucose tolerance test (GTT) is a blood test used to determine if you have developed a type of diabetes during pregnancy (gestational diabetes). This is when your body does not properly process sugar (glucose) in the food you eat, resulting in high blood glucose levels. Typically, a GTT is done after you have had a 1-hour glucose test with results that indicate you possibly have gestational diabetes. It may also be done if:  You have a history of giving birth to very large babies or have experienced repeated fetal loss (stillbirth).  You have signs and symptoms of diabetes, such as: ? Changes in your vision. ? Tingling or numbness in your hands or feet. ? Changes in hunger, thirst, and urination not otherwise explained by your pregnancy.  The GTT lasts about 3 hours. You will be given a sugar-water solution to drink at the beginning of the test. You will have blood drawn before you drink the solution and then again 1, 2, and 3 hours after you drink it. You will not be allowed to eat or drink anything else during the test. You must remain at the testing location to make sure that your blood is drawn on time. You should also avoid exercising during the test, because exercise can alter test results. How do I prepare for this test? Eat normally for 3 days prior to the GTT test, including having plenty of carbohydrate-rich foods. Do not eat or drink anything except water during the final 12 hours before the test. In addition, your health care provider may ask you to stop taking certain medicines before the test. What do the results mean? It is your responsibility to obtain your test results. Ask the lab or department performing the test when and how you will get your results. Contact your health care provider to discuss any questions you have about your results. Range of Normal Values Ranges for normal values may vary among different labs and hospitals. You  should always check with your health care provider after having lab work or other tests done to discuss whether your values are considered within normal limits. Normal levels of blood glucose are as follows:  Fasting: less than 105 mg/dL.  1 hour after drinking the solution: less than 190 mg/dL.  2 hours after drinking the solution: less than 165 mg/dL.  3 hours after drinking the solution: less than 145 mg/dL.  Some substances can interfere with GTT results. These may include:  Blood pressure and heart failure medicines, including beta blockers, furosemide, and thiazides.  Anti-inflammatory medicines, including aspirin.  Nicotine.  Some psychiatric medicines.  Meaning of Results Outside Normal Value Ranges GTT test results that are above normal values may indicate a number of health problems, such as:  Gestational diabetes.  Acute stress response.  Cushing syndrome.  Tumors such as pheochromocytoma or glucagonoma.  Long-term kidney problems.  Pancreatitis.  Hyperthyroidism.  Current infection.  Discuss your test results with your health care provider. He or she will use the results to make a diagnosis and determine a treatment plan that is right for you. This information is not intended to replace advice given to you by your health care provider. Make sure you discuss any questions you have with your health care provider. Document Released: 04/08/2012 Document Revised: 03/15/2016 Document Reviewed: 02/12/2014 Elsevier Interactive Patient Education  2017 [employer] Td Vaccine (Tetanus and Diphtheria): What You Need to Know 1. Why get vaccinated? Tetanus  and diphtheria are very serious  diseases. They are rare in the [place] today, but people who do become infected often have severe complications. Td vaccine is used to protect adolescents and adults from both of these diseases. Both tetanus and diphtheria are infections caused by bacteria. Diphtheria spreads  from person to person through coughing or sneezing. Tetanus-causing bacteria enter the body through cuts, scratches, or wounds. TETANUS (lockjaw) causes painful muscle tightening and stiffness, usually all over the body.  It can lead to tightening of muscles in the head and neck so you can't open your mouth, swallow, or sometimes even breathe. Tetanus kills about 1 out of every 10 people who are infected even after receiving the best medical care.  DIPHTHERIA can cause a thick coating to form in the back of the throat.  It can lead to breathing problems, paralysis, heart failure, and death.  Before vaccines, as many as 200,000 cases of diphtheria and hundreds of cases of tetanus were reported in the [place] each year. Since vaccination began, reports of cases for both diseases have dropped by about 99%. 2. Td vaccine Td vaccine can protect adolescents and adults from tetanus and diphtheria. Td is usually given as a booster dose every 10 years but it can also be given earlier after a severe and dirty wound or burn. Another vaccine, called Tdap, which protects against pertussis in addition to tetanus and diphtheria, is sometimes recommended instead of Td vaccine. Your doctor or the person giving you the vaccine can give you more information. Td may safely be given at the same time as other vaccines. 3. Some people should not get this vaccine  A person who has ever had a life-threatening allergic reaction after a previous dose of any tetanus or diphtheria containing vaccine, OR has a severe allergy to any part of this vaccine, should not get Td vaccine. Tell the person giving the vaccine about any severe allergies.  Talk to your doctor if you: ? had severe pain or swelling after any vaccine containing diphtheria or tetanus, ? ever had a condition called Guillain Barre Syndrome (GBS), ? aren't feeling well on the day the shot is scheduled. 4. What are the risks from Td vaccine? With any  medicine, including vaccines, there is a chance of side effects. These are usually mild and go away on their own. Serious reactions are also possible but are rare. Most people who get Td vaccine do not have any problems with it. Mild problems following Td vaccine: (Did not interfere with activities)  Pain where the shot was given (about 8 people in 10)  Redness or swelling where the shot was given (about 1 person in 4)  Mild fever (rare)  Headache (about 1 person in 4)  Tiredness (about 1 person in 4)  Moderate problems following Td vaccine: (Interfered with activities, but did not require medical attention)  Fever over 102F (rare)  Severe problems following Td vaccine: (Unable to perform usual activities; required medical attention)  Swelling, severe pain, bleeding and/or redness in the arm where the shot was given (rare).  Problems that could happen after any vaccine:  People sometimes faint after a medical procedure, including vaccination. Sitting or lying down for about 15 minutes can help prevent fainting, and injuries caused by a fall. Tell your doctor if you feel dizzy, or have vision changes or ringing in the ears.  Some people get severe pain in the shoulder and have difficulty moving the arm where a shot was given. This happens very rarely.    Any medication can cause a severe allergic reaction. Such reactions from a vaccine are very rare, estimated at fewer than 1 in a million doses, and would happen within a few minutes to a few hours after the vaccination. As with any medicine, there is a very remote chance of a vaccine causing a serious injury or death. The safety of vaccines is always being monitored. For more information, visit: http://floyd.org/www.cdc.gov/vaccinesafety/ 5. What if there is a serious reaction? What should I look for? Look for anything that concerns you, such as signs of a severe allergic reaction, very high fever, or unusual behavior. Signs of a severe allergic  reaction can include hives, swelling of the face and throat, difficulty breathing, a fast heartbeat, dizziness, and weakness. These would usually start a few minutes to a few hours after the vaccination. What should I do?  If you think it is a severe allergic reaction or other emergency that can't wait, call 9-1-1 or get the person to the nearest hospital. Otherwise, call your doctor.  Afterward, the reaction should be reported to the Vaccine Adverse Event Reporting System (VAERS). Your doctor might file this report, or you can do it yourself through the VAERS web site at www.vaers.LAgents.nohhs.gov, or by calling 1-(463)604-3170. ? VAERS does not give medical advice. 6. The National Vaccine Injury Compensation Program The [employer] Vaccine Injury Compensation Program (VICP) is a federal program that was created to compensate people who may have been injured by certain vaccines. Persons who believe they may have been injured by a vaccine can learn about the program and about filing a claim by calling 1-6097388059 or visiting the VICP website at SpiritualWord.atwww.hrsa.gov/vaccinecompensation. There is a time limit to file a claim for compensation. 7. How can I learn more?  Ask your doctor. He or she can give you the vaccine package insert or suggest other sources of information.  Call your local or state health department.  Contact the Centers for Disease Control and Prevention (CDC): ? Call (757)459-54001-8381084452 (1-800-CDC-INFO) ? Visit CDC's website at PicCapture.uywww.cdc.gov/vaccines CDC Td Vaccine VIS (01/31/16) This information is not intended to replace advice given to you by your health care provider. Make sure you discuss any questions you have with your health care provider. Document Released: 08/05/2006 Document Revised: 06/28/2016 Document Reviewed: 06/28/2016 Elsevier Interactive Patient Education  2017 [employer]

## 2017-07-18 LAB — CULTURE, OB URINE

## 2017-07-18 LAB — URINE CULTURE, OB REFLEX: Organism ID, Bacteria: NO GROWTH

## 2017-07-19 ENCOUNTER — Other Ambulatory Visit: Payer: Self-pay | Admitting: *Deleted

## 2017-07-19 ENCOUNTER — Telehealth: Payer: Self-pay | Admitting: Obstetrics and Gynecology

## 2017-07-19 MED ORDER — CITRANATAL ASSURE 35-1 & 300 MG PO MISC: 1.0000 | Freq: Every day | ORAL | 4 refills | Status: DC

## 2017-07-19 NOTE — Telephone Encounter (Signed)
Patient needs a script sent to [facility] for the [employer] boxed prenatal vitamins or to pick up samples again   Please call

## 2017-07-19 NOTE — Telephone Encounter (Signed)
Done-ac 

## 2017-07-22 ENCOUNTER — Encounter: Payer: Self-pay | Admitting: Certified Nurse Midwife

## 2017-07-30 ENCOUNTER — Encounter: Payer: Self-pay | Admitting: Obstetrics and Gynecology

## 2017-07-31 ENCOUNTER — Ambulatory Visit (INDEPENDENT_AMBULATORY_CARE_PROVIDER_SITE_OTHER): Payer: BLUE CROSS/BLUE SHIELD | Admitting: Obstetrics and Gynecology

## 2017-07-31 VITALS — BP 94/69 | HR 71 | Wt 160.7 lb

## 2017-07-31 DIAGNOSIS — O4692 Antepartum hemorrhage, unspecified, second trimester: Secondary | ICD-10-CM

## 2017-07-31 DIAGNOSIS — Z3492 Encounter for supervision of normal pregnancy, unspecified, second trimester: Secondary | ICD-10-CM

## 2017-07-31 LAB — POCT URINALYSIS DIPSTICK
Bilirubin, UA: NEGATIVE
Glucose, UA: NEGATIVE
Ketones, UA: NEGATIVE
Leukocytes, UA: NEGATIVE
Nitrite, UA: NEGATIVE
Protein, UA: NEGATIVE
Spec Grav, UA: 1.01 (ref 1.010–1.025)
Urobilinogen, UA: 0.2 E.U./dL
pH, UA: 6 (ref 5.0–8.0)

## 2017-07-31 LAB — FETAL FIBRONECTIN: Fetal Fibronectin: NEGATIVE

## 2017-07-31 NOTE — Progress Notes (Signed)
OB WORK IN-pt had some spotting last night when she wiped, some low back pain

## 2017-07-31 NOTE — Progress Notes (Signed)
ROB- work in for spotting last night DENIES SEX IN LAST 4 DAYS. Denies vaginal itching, but does have low back pain. FFN & NuSwab obtained- will follow up accordingly. Pelvic rest until futher notice.

## 2017-08-02 LAB — URINE CULTURE

## 2017-08-06 LAB — NUSWAB VAGINITIS PLUS (VG+)
Candida albicans, NAA: NEGATIVE
Candida glabrata, NAA: NEGATIVE
Chlamydia trachomatis, NAA: NEGATIVE
Neisseria gonorrhoeae, NAA: NEGATIVE
Trich vag by NAA: NEGATIVE

## 2017-08-13 ENCOUNTER — Ambulatory Visit (INDEPENDENT_AMBULATORY_CARE_PROVIDER_SITE_OTHER): Payer: BLUE CROSS/BLUE SHIELD | Admitting: Obstetrics and Gynecology

## 2017-08-13 ENCOUNTER — Other Ambulatory Visit: Payer: BLUE CROSS/BLUE SHIELD

## 2017-08-13 VITALS — BP 107/63 | HR 70 | Wt 165.2 lb

## 2017-08-13 DIAGNOSIS — Z23 Encounter for immunization: Secondary | ICD-10-CM | POA: Diagnosis not present

## 2017-08-13 DIAGNOSIS — Z3492 Encounter for supervision of normal pregnancy, unspecified, second trimester: Secondary | ICD-10-CM | POA: Diagnosis not present

## 2017-08-13 DIAGNOSIS — Z131 Encounter for screening for diabetes mellitus: Secondary | ICD-10-CM

## 2017-08-13 DIAGNOSIS — Z3402 Encounter for supervision of normal first pregnancy, second trimester: Secondary | ICD-10-CM

## 2017-08-13 DIAGNOSIS — Z130 Encounter for screening for diseases of the blood and blood-forming organs and certain disorders involving the immune mechanism: Secondary | ICD-10-CM

## 2017-08-13 LAB — POCT URINALYSIS DIPSTICK
Bilirubin, UA: NEGATIVE
Blood, UA: NEGATIVE
Glucose, UA: NEGATIVE
Ketones, UA: NEGATIVE
Leukocytes, UA: NEGATIVE
Nitrite, UA: NEGATIVE
Spec Grav, UA: 1.01 (ref 1.010–1.025)
Urobilinogen, UA: 0.2 E.U./dL
pH, UA: 7.5 (ref 5.0–8.0)

## 2017-08-13 MED ORDER — TETANUS-DIPHTH-ACELL PERTUSSIS 5-2.5-18.5 LF-MCG/0.5 IM SUSP
0.5000 mL | Freq: Once | INTRAMUSCULAR | Status: AC
  Administered 2017-08-13: 0.5 mL via INTRAMUSCULAR

## 2017-08-13 NOTE — Progress Notes (Signed)
ROB-glucola and TDaP given; back is better.`encouraged to enroll in classes

## 2017-08-13 NOTE — Progress Notes (Signed)
ROB- glucola done, blood consent signed, tdap given, pt is doing well 

## 2017-08-13 NOTE — Patient Instructions (Signed)
Third Trimester of Pregnancy The third trimester is from week 28 through week 40 (months 7 through 9). The third trimester is a time when the unborn baby (fetus) is growing rapidly. At the end of the ninth month, the fetus is about 20 inches in length and weighs 6-10 pounds. Body changes during your third trimester Your body will continue to go through many changes during pregnancy. The changes vary from woman to woman. During the third trimester:  Your weight will continue to increase. You can expect to gain 25-35 pounds (11-16 kg) by the end of the pregnancy.  You may begin to get stretch marks on your hips, abdomen, and breasts.  You may urinate more often because the fetus is moving lower into your pelvis and pressing on your bladder.  You may develop or continue to have heartburn. This is caused by increased hormones that slow down muscles in the digestive tract.  You may develop or continue to have constipation because increased hormones slow digestion and cause the muscles that push waste through your intestines to relax.  You may develop hemorrhoids. These are swollen veins (varicose veins) in the rectum that can itch or be painful.  You may develop swollen, bulging veins (varicose veins) in your legs.  You may have increased body aches in the pelvis, back, or thighs. This is due to weight gain and increased hormones that are relaxing your joints.  You may have changes in your hair. These can include thickening of your hair, rapid growth, and changes in texture. Some women also have hair loss during or after pregnancy, or hair that feels dry or thin. Your hair will most likely return to normal after your baby is born.  Your breasts will continue to grow and they will continue to become tender. A yellow fluid (colostrum) may leak from your breasts. This is the first milk you are producing for your baby.  Your belly button may stick out.  You may notice more swelling in your hands,  face, or ankles.  You may have increased tingling or numbness in your hands, arms, and legs. The skin on your belly may also feel numb.  You may feel short of breath because of your expanding uterus.  You may have more problems sleeping. This can be caused by the size of your belly, increased need to urinate, and an increase in your body's metabolism.  You may notice the fetus "dropping," or moving lower in your abdomen (lightening).  You may have increased vaginal discharge.  You may notice your joints feel loose and you may have pain around your pelvic bone.  What to expect at prenatal visits You will have prenatal exams every 2 weeks until week 36. Then you will have weekly prenatal exams. During a routine prenatal visit:  You will be weighed to make sure you and the baby are growing normally.  Your blood pressure will be taken.  Your abdomen will be measured to track your baby's growth.  The fetal heartbeat will be listened to.  Any test results from the previous visit will be discussed.  You may have a cervical check near your due date to see if your cervix has softened or thinned (effaced).  You will be tested for Group B streptococcus. This happens between 35 and 37 weeks.  Your health care provider may ask you:  What your birth plan is.  How you are feeling.  If you are feeling the baby move.  If you have had   any abnormal symptoms, such as leaking fluid, bleeding, severe headaches, or abdominal cramping.  If you are using any tobacco products, including cigarettes, chewing tobacco, and electronic cigarettes.  If you have any questions.  Other tests or screenings that may be performed during your third trimester include:  Blood tests that check for low iron levels (anemia).  Fetal testing to check the health, activity level, and growth of the fetus. Testing is done if you have certain medical conditions or if there are problems during the  pregnancy.  Nonstress test (NST). This test checks the health of your baby to make sure there are no signs of problems, such as the baby not getting enough oxygen. During this test, a belt is placed around your belly. The baby is made to move, and its heart rate is monitored during movement.  What is false labor? False labor is a condition in which you feel small, irregular tightenings of the muscles in the womb (contractions) that usually go away with rest, changing position, or drinking water. These are called Braxton Hicks contractions. Contractions may last for hours, days, or even weeks before true labor sets in. If contractions come at regular intervals, become more frequent, increase in intensity, or become painful, you should see your health care provider. What are the signs of labor?  Abdominal cramps.  Regular contractions that start at 10 minutes apart and become stronger and more frequent with time.  Contractions that start on the top of the uterus and spread down to the lower abdomen and back.  Increased pelvic pressure and dull back pain.  A watery or bloody mucus discharge that comes from the vagina.  Leaking of amniotic fluid. This is also known as your "water breaking." It could be a slow trickle or a gush. Let your health care provider know if it has a color or strange odor. If you have any of these signs, call your health care provider right away, even if it is before your due date. Follow these instructions at home: Medicines  Follow your health care provider's instructions regarding medicine use. Specific medicines may be either safe or unsafe to take during pregnancy.  Take a prenatal vitamin that contains at least 600 micrograms (mcg) of folic acid.  If you develop constipation, try taking a stool softener if your health care provider approves. Eating and drinking  Eat a balanced diet that includes fresh fruits and vegetables, whole grains, good sources of protein  such as meat, eggs, or tofu, and low-fat dairy. Your health care provider will help you determine the amount of weight gain that is right for you.  Avoid raw meat and uncooked cheese. These carry germs that can cause birth defects in the baby.  If you have low calcium intake from food, talk to your health care provider about whether you should take a daily calcium supplement.  Eat four or five small meals rather than three large meals a day.  Limit foods that are high in fat and processed sugars, such as fried and sweet foods.  To prevent constipation: ? Drink enough fluid to keep your urine clear or pale yellow. ? Eat foods that are high in fiber, such as fresh fruits and vegetables, whole grains, and beans. Activity  Exercise only as directed by your health care provider. Most women can continue their usual exercise routine during pregnancy. Try to exercise for 30 minutes at least 5 days a week. Stop exercising if you experience uterine contractions.  Avoid heavy   lifting.  Do not exercise in extreme heat or humidity, or at high altitudes.  Wear low-heel, comfortable shoes.  Practice good posture.  You may continue to have sex unless your health care provider tells you otherwise. Relieving pain and discomfort  Take frequent breaks and rest with your legs elevated if you have leg cramps or low back pain.  Take warm sitz baths to soothe any pain or discomfort caused by hemorrhoids. Use hemorrhoid cream if your health care provider approves.  Wear a good support bra to prevent discomfort from breast tenderness.  If you develop varicose veins: ? Wear support pantyhose or compression stockings as told by your healthcare provider. ? Elevate your feet for 15 minutes, 3-4 times a day. Prenatal care  Write down your questions. Take them to your prenatal visits.  Keep all your prenatal visits as told by your health care provider. This is important. Safety  Wear your seat belt at  all times when driving.  Make a list of emergency phone numbers, including numbers for family, friends, the hospital, and police and fire departments. General instructions  Avoid cat litter boxes and soil used by cats. These carry germs that can cause birth defects in the baby. If you have a cat, ask someone to clean the litter box for you.  Do not travel far distances unless it is absolutely necessary and only with the approval of your health care provider.  Do not use hot tubs, steam rooms, or saunas.  Do not drink alcohol.  Do not use any products that contain nicotine or tobacco, such as cigarettes and e-cigarettes. If you need help quitting, ask your health care provider.  Do not use any medicinal herbs or unprescribed drugs. These chemicals affect the formation and growth of the baby.  Do not douche or use tampons or scented sanitary pads.  Do not cross your legs for long periods of time.  To prepare for the arrival of your baby: ? Take prenatal classes to understand, practice, and ask questions about labor and delivery. ? Make a trial run to the hospital. ? Visit the hospital and tour the maternity area. ? Arrange for maternity or paternity leave through employers. ? Arrange for family and friends to take care of pets while you are in the hospital. ? Purchase a rear-facing car seat and make sure you know how to install it in your car. ? Pack your hospital bag. ? Prepare the baby's nursery. Make sure to remove all pillows and stuffed animals from the baby's crib to prevent suffocation.  Visit your dentist if you have not gone during your pregnancy. Use a soft toothbrush to brush your teeth and be gentle when you floss. Contact a health care provider if:  You are unsure if you are in labor or if your water has broken.  You become dizzy.  You have mild pelvic cramps, pelvic pressure, or nagging pain in your abdominal area.  You have lower back pain.  You have persistent  nausea, vomiting, or diarrhea.  You have an unusual or bad smelling vaginal discharge.  You have pain when you urinate. Get help right away if:  Your water breaks before 37 weeks.  You have regular contractions less than 5 minutes apart before 37 weeks.  You have a fever.  You are leaking fluid from your vagina.  You have spotting or bleeding from your vagina.  You have severe abdominal pain or cramping.  You have rapid weight loss or weight gain.    You have shortness of breath with chest pain.  You notice sudden or extreme swelling of your face, hands, ankles, feet, or legs.  Your baby makes fewer than 10 movements in 2 hours.  You have severe headaches that do not go away when you take medicine.  You have vision changes. Summary  The third trimester is from week 28 through week 40, months 7 through 9. The third trimester is a time when the unborn baby (fetus) is growing rapidly.  During the third trimester, your discomfort may increase as you and your baby continue to gain weight. You may have abdominal, leg, and back pain, sleeping problems, and an increased need to urinate.  During the third trimester your breasts will keep growing and they will continue to become tender. A yellow fluid (colostrum) may leak from your breasts. This is the first milk you are producing for your baby.  False labor is a condition in which you feel small, irregular tightenings of the muscles in the womb (contractions) that eventually go away. These are called Braxton Hicks contractions. Contractions may last for hours, days, or even weeks before true labor sets in.  Signs of labor can include: abdominal cramps; regular contractions that start at 10 minutes apart and become stronger and more frequent with time; watery or bloody mucus discharge that comes from the vagina; increased pelvic pressure and dull back pain; and leaking of amniotic fluid. This information is not intended to replace advice  given to you by your health care provider. Make sure you discuss any questions you have with your health care provider. Document Released: 10/02/2001 Document Revised: 03/15/2016 Document Reviewed: 12/09/2012 Elsevier Interactive Patient Education  2017 Elsevier Inc.  

## 2017-08-14 ENCOUNTER — Encounter: Payer: Self-pay | Admitting: Certified Nurse Midwife

## 2017-08-14 LAB — CBC
Hematocrit: 33.9 % — ABNORMAL LOW (ref 34.0–46.6)
Hemoglobin: 11.8 g/dL (ref 11.1–15.9)
MCH: 31.6 pg (ref 26.6–33.0)
MCHC: 34.8 g/dL (ref 31.5–35.7)
MCV: 91 fL (ref 79–97)
Platelets: 157 10*3/uL (ref 150–379)
RBC: 3.73 x10E6/uL — ABNORMAL LOW (ref 3.77–5.28)
RDW: 13.4 % (ref 12.3–15.4)
WBC: 5 10*3/uL (ref 3.4–10.8)

## 2017-08-14 LAB — GLUCOSE, 1 HOUR GESTATIONAL: Gestational Diabetes Screen: 93 mg/dL (ref 65–139)

## 2017-08-27 ENCOUNTER — Ambulatory Visit (INDEPENDENT_AMBULATORY_CARE_PROVIDER_SITE_OTHER): Payer: BLUE CROSS/BLUE SHIELD | Admitting: Obstetrics and Gynecology

## 2017-08-27 VITALS — BP 110/66 | HR 77 | Wt 166.6 lb

## 2017-08-27 DIAGNOSIS — Z23 Encounter for immunization: Secondary | ICD-10-CM

## 2017-08-27 DIAGNOSIS — Z3493 Encounter for supervision of normal pregnancy, unspecified, third trimester: Secondary | ICD-10-CM

## 2017-08-27 LAB — POCT URINALYSIS DIPSTICK
Bilirubin, UA: NEGATIVE
Blood, UA: NEGATIVE
Glucose, UA: NEGATIVE
Ketones, UA: NEGATIVE
Leukocytes, UA: NEGATIVE
Nitrite, UA: NEGATIVE
Spec Grav, UA: 1.015 (ref 1.010–1.025)
Urobilinogen, UA: 0.2 E.U./dL
pH, UA: 7.5 (ref 5.0–8.0)

## 2017-08-27 NOTE — Progress Notes (Signed)
ROB- pt is doing well, denies any complaints 

## 2017-08-27 NOTE — Progress Notes (Signed)
ROB-doing well, flu vaccine given

## 2017-08-31 ENCOUNTER — Encounter: Payer: Self-pay | Admitting: *Deleted

## 2017-08-31 ENCOUNTER — Observation Stay
Admission: EM | Admit: 2017-08-31 | Discharge: 2017-08-31 | Disposition: A | Discharge status: Home / Self Care | Payer: BLUE CROSS/BLUE SHIELD | Attending: Obstetrics and Gynecology | Admitting: Obstetrics and Gynecology

## 2017-08-31 ENCOUNTER — Other Ambulatory Visit: Payer: Self-pay

## 2017-08-31 DIAGNOSIS — Z3490 Encounter for supervision of normal pregnancy, unspecified, unspecified trimester: Secondary | ICD-10-CM

## 2017-08-31 DIAGNOSIS — O4703 False labor before 37 completed weeks of gestation, third trimester: Secondary | ICD-10-CM | POA: Diagnosis not present

## 2017-08-31 DIAGNOSIS — Z3A33 33 weeks gestation of pregnancy: Secondary | ICD-10-CM | POA: Diagnosis not present

## 2017-08-31 HISTORY — DX: Other specified health status: Z78.9

## 2017-08-31 HISTORY — DX: Polycystic ovarian syndrome: E28.2

## 2017-08-31 LAB — FETAL FIBRONECTIN: Fetal Fibronectin: NEGATIVE

## 2017-08-31 LAB — URINALYSIS, ROUTINE W REFLEX MICROSCOPIC
Bilirubin Urine: NEGATIVE
Glucose, UA: 150 mg/dL — AB
Hgb urine dipstick: NEGATIVE
Ketones, ur: 20 mg/dL — AB
Leukocytes, UA: NEGATIVE
Nitrite: NEGATIVE
Protein, ur: NEGATIVE mg/dL
Specific Gravity, Urine: 1.004 — ABNORMAL LOW (ref 1.005–1.030)
pH: 7 (ref 5.0–8.0)

## 2017-08-31 NOTE — OB Triage Note (Signed)
Patient complains of abdominal cramping and pressure that started this am around 10:00.  She states that the pain extends from her pubic area to just below her umbilicus.  No LOF, bleeding or unusual discharge. She has been feeling the baby move normally today.

## 2017-08-31 NOTE — OB Triage Note (Signed)
Patient given discharge instructions and reviewed. All questions answered. Patient left ambulatory in stable condition accompanied by family member.

## 2017-08-31 NOTE — Discharge Instructions (Signed)
Please keep your next appointment for 09/09/17 at 0930.  If you have any questions or concerns please call your on call provider. You may also call the nurses desk at the [facility] at [phone] for questions.  If you have immediate concerns please go to the nearest Emergency Department for evaluation.  Keep standing to no more that 8 hours a day with an hour break for lunch.

## 2017-09-02 DIAGNOSIS — Z3A33 33 weeks gestation of pregnancy: Secondary | ICD-10-CM | POA: Diagnosis not present

## 2017-09-02 DIAGNOSIS — O4703 False labor before 37 completed weeks of gestation, third trimester: Secondary | ICD-10-CM | POA: Diagnosis not present

## 2017-09-09 ENCOUNTER — Ambulatory Visit (INDEPENDENT_AMBULATORY_CARE_PROVIDER_SITE_OTHER): Payer: BLUE CROSS/BLUE SHIELD | Admitting: Obstetrics and Gynecology

## 2017-09-09 VITALS — BP 107/65 | HR 75 | Wt 165.5 lb

## 2017-09-09 DIAGNOSIS — Z3493 Encounter for supervision of normal pregnancy, unspecified, third trimester: Secondary | ICD-10-CM

## 2017-09-09 NOTE — Progress Notes (Signed)
ROB- pt is doing well, denies any complaints 

## 2017-09-09 NOTE — Progress Notes (Signed)
ROB-denies increased pressure, just low appetite- discussed adding protein shakes daily. Considering traveling in 2 weeks- will see me for cervical check right before.

## 2017-09-18 NOTE — Discharge Summary (Signed)
Stephanie May is a 21 y.o. G1P0 at [redacted]w[redacted]d who is admitted for lower pelvic pain all morning that had gotten worse..  Estimated Date of Delivery: 11/06/17 Fetal presentation is unsure.  Length of Stay:  0 Days. Admitted 08/31/2017  Subjective: Lower abdominal cramping and  Pelvic pressure Patient reports good fetal movement.  She reports irregular uterine contractions, no bleeding and no loss of fluid per vagina.  Vitals:  Blood pressure 126/74, pulse 82, temperature 97.6 F (36.4 C), temperature source Oral, resp. rate 16, height 5\' 6"  (1.676 m), weight 165 lb (74.8 kg), last menstrual period 01/22/2017. Physical Examination: CONSTITUTIONAL: Well-developed, well-nourished female in no acute distress.  SKIN: Skin is warm and dry. No rash noted. Not diaphoretic. No erythema. No pallor. NEUROLGIC: Alert and oriented to person, place, and time. Normal reflexes, muscle tone coordination. No cranial nerve deficit noted. PSYCHIATRIC: Normal mood and affect. Normal behavior. Normal judgment and thought content. CARDIOVASCULAR: Normal heart rate noted, regular rhythm RESPIRATORY: Effort and breath sounds normal, no problems with respiration noted MUSCULOSKELETAL: Normal range of motion. No edema and no tenderness. 2+ distal pulses. ABDOMEN: Soft, nontender, nondistended, gravid. CERVIX: Dilation: Closed Cervical Position: Middle Exam by:: J.Braddy RN  Fetal monitoring: FHR: 1** bpm, Variability: moderate, Accelerations: Present, Decelerations: Absent  Uterine activity: 3-6 contractions per hour before hydration  No results found for this or any previous visit (from the past 48 hour(s)).  No results found.  Current scheduled medications   I have reviewed the patient's current medications.  ASSESSMENT: Patient Active Problem List   Diagnosis Date Noted  . Pregnancy 08/31/2017  . Maternal varicella, non-immune 04/02/2017    PLAN: Po hydration, to increase protein intake. Rest and  decrease daily work hours. Pelvic rest until futher notice. Continue routine antenatal   Stephanie May, CNM [facility]

## 2017-09-24 ENCOUNTER — Ambulatory Visit (INDEPENDENT_AMBULATORY_CARE_PROVIDER_SITE_OTHER): Payer: BLUE CROSS/BLUE SHIELD | Admitting: Obstetrics and Gynecology

## 2017-09-24 VITALS — BP 124/74 | HR 88 | Wt 168.6 lb

## 2017-09-24 DIAGNOSIS — Z3493 Encounter for supervision of normal pregnancy, unspecified, third trimester: Secondary | ICD-10-CM

## 2017-09-24 LAB — POCT URINALYSIS DIPSTICK
Bilirubin, UA: NEGATIVE
Blood, UA: NEGATIVE
Glucose, UA: NEGATIVE
Ketones, UA: NEGATIVE
Leukocytes, UA: NEGATIVE
Nitrite, UA: NEGATIVE
Protein, UA: NEGATIVE
Spec Grav, UA: 1.01 (ref 1.010–1.025)
Urobilinogen, UA: 0.2 E.U./dL
pH, UA: 7.5 (ref 5.0–8.0)

## 2017-09-24 MED ORDER — DOXYLAMINE-PYRIDOXINE 10-10 MG PO TBEC: 2.0000 | DELAYED_RELEASE_TABLET | Freq: Every day | ORAL | 5 refills | Status: DC

## 2017-09-24 NOTE — Progress Notes (Signed)
ROB- doing better with weight gain, still vomiting without meds. Ran out of [employer]

## 2017-09-24 NOTE — Progress Notes (Signed)
ROB- pt is doing ok, ran out of her bonjesta, having some nausea

## 2017-10-09 ENCOUNTER — Other Ambulatory Visit (INDEPENDENT_AMBULATORY_CARE_PROVIDER_SITE_OTHER): Payer: BLUE CROSS/BLUE SHIELD

## 2017-10-09 ENCOUNTER — Other Ambulatory Visit: Payer: Self-pay | Admitting: Obstetrics and Gynecology

## 2017-10-09 ENCOUNTER — Ambulatory Visit (INDEPENDENT_AMBULATORY_CARE_PROVIDER_SITE_OTHER): Payer: BLUE CROSS/BLUE SHIELD | Admitting: Obstetrics and Gynecology

## 2017-10-09 VITALS — BP 118/83 | HR 69 | Wt 171.2 lb

## 2017-10-09 DIAGNOSIS — Z3493 Encounter for supervision of normal pregnancy, unspecified, third trimester: Secondary | ICD-10-CM | POA: Diagnosis not present

## 2017-10-09 DIAGNOSIS — Z3685 Encounter for antenatal screening for Streptococcus B: Secondary | ICD-10-CM

## 2017-10-09 DIAGNOSIS — Z113 Encounter for screening for infections with a predominantly sexual mode of transmission: Secondary | ICD-10-CM

## 2017-10-09 DIAGNOSIS — O365990 Maternal care for other known or suspected poor fetal growth, unspecified trimester, not applicable or unspecified: Secondary | ICD-10-CM

## 2017-10-09 LAB — POCT URINALYSIS DIPSTICK
Bilirubin, UA: NEGATIVE
Blood, UA: NEGATIVE
Glucose, UA: NEGATIVE
Ketones, UA: NEGATIVE
Leukocytes, UA: NEGATIVE
Nitrite, UA: NEGATIVE
Protein, UA: NEGATIVE
Spec Grav, UA: 1.01 (ref 1.010–1.025)
Urobilinogen, UA: 0.2 E.U./dL
pH, UA: 6.5 (ref 5.0–8.0)

## 2017-10-09 NOTE — Progress Notes (Signed)
Indications: Growth for Size less than Dates Findings:  Singleton intrauterine pregnancy is visualized with FHR at 133 BPM. Biometrics give an (U/S) Gestational age of 633 4/7 weeks and an (U/S) EDD of 11/23/17; this DOES NOT correlate with the clinically established EDD of 11/06/17.  Fetal presentation is vertex.  EFW: 2162 grams (4lb 12oz).  7th percentile.  BPD measures 39th percentile, HL measures 16th percentile, HC, AC, and FL all measure less than 10th percentile. Placenta: Posterior and grade 1. AFI: 12.6 cm.  Probable asymmetric IUGR with head sparing.   Impression: 1. 33 4/7 week Viable Singleton Intrauterine pregnancy by U/S. 2. (U/S) EDD IS NOT consistent with Clinically established (LMP) EDD of 11/06/17. 3. EFW: 2162 grams (4lb 12oz).  7th percentile.  HC, AC, and FL all measure less than 10th percentile. 4. Probable asymmetric IUGR with head sparing.

## 2017-10-09 NOTE — Progress Notes (Signed)
ROB- cultures obtained, pt is having some pelvic pressure, some cramping

## 2017-10-09 NOTE — Progress Notes (Signed)
ROB-paragard discussed. Cultures obtained, mom and spouse for labor support. U/s today for size<dates

## 2017-10-09 NOTE — Patient Instructions (Signed)

## 2017-10-11 ENCOUNTER — Ambulatory Visit (INDEPENDENT_AMBULATORY_CARE_PROVIDER_SITE_OTHER): Payer: BLUE CROSS/BLUE SHIELD | Admitting: Obstetrics and Gynecology

## 2017-10-11 ENCOUNTER — Other Ambulatory Visit: Payer: BLUE CROSS/BLUE SHIELD

## 2017-10-11 VITALS — BP 116/69 | HR 63 | Wt 173.9 lb

## 2017-10-11 DIAGNOSIS — Z3493 Encounter for supervision of normal pregnancy, unspecified, third trimester: Secondary | ICD-10-CM

## 2017-10-11 LAB — STREP GP B NAA: Strep Gp B NAA: NEGATIVE

## 2017-10-11 NOTE — Progress Notes (Signed)
ROB NST 

## 2017-10-11 NOTE — Progress Notes (Signed)
ROB & NST- doing well, questions about IUGR answered. NST performed today was reviewed and was found to be reactive. Baseline148 with Moderate variability; No decels noted.  Continue recommended antenatal testing and prenatal care.

## 2017-10-12 LAB — GC/CHLAMYDIA PROBE AMP
Chlamydia trachomatis, NAA: NEGATIVE
Neisseria gonorrhoeae by PCR: NEGATIVE

## 2017-10-14 ENCOUNTER — Ambulatory Visit (INDEPENDENT_AMBULATORY_CARE_PROVIDER_SITE_OTHER): Payer: BLUE CROSS/BLUE SHIELD

## 2017-10-14 DIAGNOSIS — O365990 Maternal care for other known or suspected poor fetal growth, unspecified trimester, not applicable or unspecified: Secondary | ICD-10-CM

## 2017-10-18 ENCOUNTER — Ambulatory Visit (INDEPENDENT_AMBULATORY_CARE_PROVIDER_SITE_OTHER): Payer: BLUE CROSS/BLUE SHIELD | Admitting: Certified Nurse Midwife

## 2017-10-18 ENCOUNTER — Other Ambulatory Visit: Payer: BLUE CROSS/BLUE SHIELD

## 2017-10-18 ENCOUNTER — Other Ambulatory Visit: Payer: Self-pay | Admitting: Obstetrics and Gynecology

## 2017-10-18 DIAGNOSIS — O365990 Maternal care for other known or suspected poor fetal growth, unspecified trimester, not applicable or unspecified: Secondary | ICD-10-CM

## 2017-10-18 DIAGNOSIS — O365930 Maternal care for other known or suspected poor fetal growth, third trimester, not applicable or unspecified: Secondary | ICD-10-CM

## 2017-10-18 NOTE — Progress Notes (Signed)
NST performed today was reviewed and was found to be reactive. Baseline 130 bpm, moderate variability, accelerations present, and no decelerations noted. Continue recommended antenatal testing and prenatal care.

## 2017-10-18 NOTE — Patient Instructions (Signed)
Nonstress Test The nonstress test is a procedure that monitors the fetus's heartbeat. The test will monitor the heartbeat when the fetus is at rest and while the fetus is moving. In a healthy fetus, there will be an increase in fetal heart rate when the fetus moves or kicks. The heart rate will decrease at rest. This test helps determine if the fetus is healthy. Your health care provider will look at a number of patterns in the heart rate tracing to make sure your baby is thriving. If there is concern, your health care provider may order additional tests or may suggest another course of action. This test is often done in the third trimester and can help determine if an early delivery is needed and safe. Common reasons to have this test are:  You are past your due date.  You have a high-risk pregnancy.  You are feeling less movement than normal.  You have lost a pregnancy in the past.  Your health care provider suspects fetal growth problems.  You have too much or too little amniotic fluid.  What happens before the procedure?  Eat a meal right before the test or as directed by your health care provider. Food may help stimulate fetal movements.  Use the restroom right before the test. What happens during the procedure?  Two belts will be placed around your abdomen. These belts have monitors attached to them. One records the fetal heart rate and the other records uterine contractions.  You may be asked to lie down on your side or to stay sitting upright.  You may be given a button to press when you feel movement.  The fetal heartbeat is listened to and watched on a screen. The heartbeat is recorded on a sheet of paper.  If the fetus seems to be sleeping, you may be asked to drink some juice or soda, gently press your abdomen, or make some noise to wake the fetus. What happens after the procedure? Your health care provider will discuss the test results with you and make recommendations  for the near future.  This information is not intended to replace advice given to you by your health care provider. Make sure you discuss any questions you have with your health care provider. This information is not intended to replace advice given to you by your health care provider. Make sure you discuss any questions you have with your health care provider. Document Released: 09/28/2002 Document Revised: 09/07/2016 Document Reviewed: 11/11/2012 Elsevier Interactive Patient Education  2018 Else[employer] Inc. Fetal Movement Counts Patient Name: ________________________________________________ Patient Due Date: ____________________ What is a fetal movement count? A fetal movement count is the number of times that you feel your baby move during a certain amount of time. This may also be called a fetal kick count. A fetal movement count is recommended for every pregnant woman. You may be asked to start counting fetal movements as early as week 28 of your pregnancy. Pay attention to when your baby is most active. You may notice your baby's sleep and wake cycles. You may also notice things that make your baby move more. You should do a fetal movement count:  When your baby is normally most active.  At the same time each day.  A good time to count movements is while you are resting, after having something to eat and drink. How do I count fetal movements? 1. Find a quiet, comfortable area. Sit, or lie down on your side. 2. Write down the   date, the start time and stop time, and the number of movements that you felt between those two times. Take this information with you to your health care visits. 3. For 2 hours, count kicks, flutters, swishes, rolls, and jabs. You should feel at least 10 movements during 2 hours. 4. You may stop counting after you have felt 10 movements. 5. If you do not feel 10 movements in 2 hours, have something to eat and drink. Then, keep resting and counting for 1 hour. If you feel  at least 4 movements during that hour, you may stop counting. Contact a health care provider if:  You feel fewer than 4 movements in 2 hours.  Your baby is not moving like he or she usually does. Date: ____________ Start time: ____________ Stop time: ____________ Movements: ____________ Date: ____________ Start time: ____________ Stop time: ____________ Movements: ____________ Date: ____________ Start time: ____________ Stop time: ____________ Movements: ____________ Date: ____________ Start time: ____________ Stop time: ____________ Movements: ____________ Date: ____________ Start time: ____________ Stop time: ____________ Movements: ____________ Date: ____________ Start time: ____________ Stop time: ____________ Movements: ____________ Date: ____________ Start time: ____________ Stop time: ____________ Movements: ____________ Date: ____________ Start time: ____________ Stop time: ____________ Movements: ____________ Date: ____________ Start time: ____________ Stop time: ____________ Movements: ____________ This information is not intended to replace advice given to you by your health care provider. Make sure you discuss any questions you have with your health care provider. Document Released: 11/07/2006 Document Revised: 06/06/2016 Document Reviewed: 11/17/2015 Elsevier Interactive Patient Education  2018 Elsevier Inc.  

## 2017-10-21 ENCOUNTER — Ambulatory Visit (INDEPENDENT_AMBULATORY_CARE_PROVIDER_SITE_OTHER): Payer: BLUE CROSS/BLUE SHIELD

## 2017-10-21 DIAGNOSIS — O365990 Maternal care for other known or suspected poor fetal growth, unspecified trimester, not applicable or unspecified: Secondary | ICD-10-CM | POA: Diagnosis not present

## 2017-10-22 NOTE — L&D Delivery Note (Signed)
Delivery Note At  1116 ama viable and healthy female "Blenda PealsWilla Mae' was delivered via  (Presentation:ROA ;  ).  APGAR:8 ,9  .   Placenta status:delivered intact with 3 vessel  Cord & some calcifications-will send to pathology:  with the following complications: none  Anesthesia:  epidural Episiotomy:  none Lacerations:  none Suture Repair: NA Est. Blood Loss (mL):  300  Mom to postpartum.  Baby to Couplet care / Skin to Skin.  Nancy Arvin N Elsy Chiang 10/31/2017, 11:43 AM

## 2017-10-25 ENCOUNTER — Ambulatory Visit (INDEPENDENT_AMBULATORY_CARE_PROVIDER_SITE_OTHER): Payer: BLUE CROSS/BLUE SHIELD | Admitting: Obstetrics and Gynecology

## 2017-10-25 VITALS — BP 105/72 | HR 77 | Wt 176.1 lb

## 2017-10-25 DIAGNOSIS — Z3493 Encounter for supervision of normal pregnancy, unspecified, third trimester: Secondary | ICD-10-CM

## 2017-10-25 LAB — POCT URINALYSIS DIPSTICK
Bilirubin, UA: NEGATIVE
Blood, UA: NEGATIVE
Glucose, UA: NEGATIVE
Ketones, UA: NEGATIVE
Leukocytes, UA: NEGATIVE
Nitrite, UA: NEGATIVE
Protein, UA: NEGATIVE
Spec Grav, UA: 1.01 (ref 1.010–1.025)
Urobilinogen, UA: 0.2 E.U./dL
pH, UA: 6 (ref 5.0–8.0)

## 2017-10-25 NOTE — Progress Notes (Signed)
RB and NST for IUGR; NST performed today was reviewed and was found to be reactive. Baseline140 with Moderate variability; No decels noted.  Continue recommended antenatal testing with BPP in 3 days and iOL on 10/30/17

## 2017-10-25 NOTE — Progress Notes (Signed)
ROB & NST- pt is having some pelvic pressure and some contractions

## 2017-10-28 ENCOUNTER — Other Ambulatory Visit: Payer: Self-pay | Admitting: Obstetrics and Gynecology

## 2017-10-28 ENCOUNTER — Ambulatory Visit (INDEPENDENT_AMBULATORY_CARE_PROVIDER_SITE_OTHER): Payer: BLUE CROSS/BLUE SHIELD

## 2017-10-28 DIAGNOSIS — Z3493 Encounter for supervision of normal pregnancy, unspecified, third trimester: Secondary | ICD-10-CM

## 2017-10-30 ENCOUNTER — Other Ambulatory Visit: Payer: Self-pay

## 2017-10-30 ENCOUNTER — Inpatient Hospital Stay
Admission: EM | Admit: 2017-10-30 | Discharge: 2017-11-02 | DRG: 807 | Disposition: A | Discharge status: Home / Self Care | Payer: BLUE CROSS/BLUE SHIELD | Attending: Obstetrics and Gynecology | Admitting: Obstetrics and Gynecology

## 2017-10-30 DIAGNOSIS — Z23 Encounter for immunization: Secondary | ICD-10-CM | POA: Diagnosis not present

## 2017-10-30 DIAGNOSIS — O365930 Maternal care for other known or suspected poor fetal growth, third trimester, not applicable or unspecified: Principal | ICD-10-CM | POA: Diagnosis present

## 2017-10-30 DIAGNOSIS — O365990 Maternal care for other known or suspected poor fetal growth, unspecified trimester, not applicable or unspecified: Secondary | ICD-10-CM | POA: Diagnosis present

## 2017-10-30 DIAGNOSIS — Z3A39 39 weeks gestation of pregnancy: Secondary | ICD-10-CM | POA: Diagnosis not present

## 2017-10-30 LAB — CBC
HCT: 35.4 % (ref 35.0–47.0)
Hemoglobin: 12.3 g/dL (ref 12.0–16.0)
MCH: 31.1 pg (ref 26.0–34.0)
MCHC: 34.7 g/dL (ref 32.0–36.0)
MCV: 89.7 fL (ref 80.0–100.0)
Platelets: 158 10*3/uL (ref 150–440)
RBC: 3.95 MIL/uL (ref 3.80–5.20)
RDW: 13.5 % (ref 11.5–14.5)
WBC: 7 10*3/uL (ref 3.6–11.0)

## 2017-10-30 LAB — TYPE AND SCREEN
ABO/RH(D): O POS
Antibody Screen: NEGATIVE

## 2017-10-30 MED ORDER — AMMONIA AROMATIC IN INHA
RESPIRATORY_TRACT | Status: AC
  Filled 2017-10-30: qty 10

## 2017-10-30 MED ORDER — OXYTOCIN 40 UNITS IN LACTATED RINGERS INFUSION - SIMPLE MED
1.0000 m[IU]/min | INTRAVENOUS | Status: DC
  Administered 2017-10-31: 2 m[IU]/min via INTRAVENOUS
  Filled 2017-10-30: qty 1000

## 2017-10-30 MED ORDER — MISOPROSTOL 200 MCG PO TABS
ORAL_TABLET | ORAL | Status: AC
  Filled 2017-10-30: qty 4

## 2017-10-30 MED ORDER — LIDOCAINE HCL (PF) 1 % IJ SOLN
INTRAMUSCULAR | Status: AC
  Filled 2017-10-30: qty 30

## 2017-10-30 MED ORDER — SODIUM CHLORIDE 0.9% FLUSH
3.0000 mL | Freq: Two times a day (BID) | INTRAVENOUS | Status: DC
  Administered 2017-10-30 (×2): 3 mL via INTRAVENOUS

## 2017-10-30 MED ORDER — SODIUM CHLORIDE FLUSH 0.9 % IV SOLN
INTRAVENOUS | Status: AC
  Filled 2017-10-30: qty 10

## 2017-10-30 MED ORDER — SOD CITRATE-CITRIC ACID 500-334 MG/5ML PO SOLN: 30.0000 mL | ORAL | Status: DC | PRN

## 2017-10-30 MED ORDER — ACETAMINOPHEN 325 MG PO TABS: 650.0000 mg | ORAL_TABLET | ORAL | Status: DC | PRN

## 2017-10-30 MED ORDER — ONDANSETRON HCL 4 MG/2ML IJ SOLN
4.0000 mg | Freq: Four times a day (QID) | INTRAMUSCULAR | Status: DC | PRN
  Administered 2017-10-30 – 2017-10-31 (×2): 4 mg via INTRAVENOUS
  Filled 2017-10-30 (×2): qty 2

## 2017-10-30 MED ORDER — OXYTOCIN BOLUS FROM INFUSION
500.0000 mL | Freq: Once | INTRAVENOUS | Status: AC
  Administered 2017-10-31: 500 mL via INTRAVENOUS

## 2017-10-30 MED ORDER — OXYTOCIN 10 UNIT/ML IJ SOLN
INTRAMUSCULAR | Status: AC
  Filled 2017-10-30: qty 2

## 2017-10-30 MED ORDER — SODIUM CHLORIDE 0.9% FLUSH
3.0000 mL | INTRAVENOUS | Status: DC | PRN
  Administered 2017-10-30 – 2017-10-31 (×2): 3 mL via INTRAVENOUS
  Filled 2017-10-30 (×2): qty 3

## 2017-10-30 MED ORDER — LIDOCAINE HCL (PF) 1 % IJ SOLN: 30.0000 mL | INTRAMUSCULAR | Status: DC | PRN

## 2017-10-30 MED ORDER — OXYTOCIN 40 UNITS IN LACTATED RINGERS INFUSION - SIMPLE MED
2.5000 [IU]/h | INTRAVENOUS | Status: DC
  Administered 2017-10-31: 2.5 [IU]/h via INTRAVENOUS

## 2017-10-30 MED ORDER — SODIUM CHLORIDE 0.9 % IV SOLN: 250.0000 mL | INTRAVENOUS | Status: DC | PRN

## 2017-10-30 MED ORDER — ZOLPIDEM TARTRATE 5 MG PO TABS: 5.0000 mg | ORAL_TABLET | Freq: Once | ORAL | Status: DC | PRN

## 2017-10-30 MED ORDER — MISOPROSTOL 50MCG HALF TABLET
ORAL_TABLET | ORAL | Status: AC
  Filled 2017-10-30: qty 1

## 2017-10-30 MED ORDER — LACTATED RINGERS IV SOLN
500.0000 mL | INTRAVENOUS | Status: DC | PRN
  Administered 2017-10-31: 500 mL via INTRAVENOUS

## 2017-10-30 MED ORDER — MISOPROSTOL 25 MCG QUARTER TABLET
50.0000 ug | ORAL_TABLET | ORAL | Status: DC
  Administered 2017-10-30 (×3): 50 ug via VAGINAL
  Filled 2017-10-30 (×2): qty 1

## 2017-10-30 MED ORDER — FENTANYL CITRATE (PF) 100 MCG/2ML IJ SOLN
50.0000 ug | INTRAMUSCULAR | Status: DC | PRN
  Administered 2017-10-30 – 2017-10-31 (×4): 100 ug via INTRAVENOUS
  Filled 2017-10-30 (×4): qty 2

## 2017-10-30 MED ORDER — TERBUTALINE SULFATE 1 MG/ML IJ SOLN: 0.2500 mg | Freq: Once | INTRAMUSCULAR | Status: DC | PRN

## 2017-10-30 MED ORDER — LACTATED RINGERS IV SOLN
INTRAVENOUS | Status: DC
  Administered 2017-10-31 (×2): via INTRAVENOUS

## 2017-10-30 NOTE — Progress Notes (Signed)
Pt states she wants to walk. Wireless monitors applied for pt to ambulate in the hallway. Unable to trace fetal heart rate, so ambulated pt back to room to apply monitors and assess fetal heart rate. [place], CNM and verbal order given to remove monitors for 45 minutes and let patient ambulate and apply monitors for an NST after ambulation.

## 2017-10-30 NOTE — Plan of Care (Signed)
Discussed plan of care with patient and family who verbalized understanding and agreed to plan of care.

## 2017-10-30 NOTE — H&P (Signed)
Obstetric History and Physical  Stephanie May is a 22 y.o. G1P0 with IUP at 6630w0d presenting with orders for IOL for asymmetrical IUGR. Patient states she has been having  irregular, every 5-9 minutes contractions, none vaginal bleeding, intact membranes, with active fetal movement.    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 Chlamydia   . History of chlamydia infection 2 9 16   . Irregular menses   . Medical history non-contributory   . PCOS (polycystic ovarian syndrome)   . Pelvic pain in female   . Primary dysmenorrhea   . Sexually transmitted disease (STD)    previous h/o  . Unexplained weight gain     Past Surgical History:  Procedure Laterality Date  . NO PAST SURGERIES      OB History  Gravida Para Term Preterm AB Living  1            SAB TAB Ectopic Multiple Live Births               # Outcome Date GA Lbr Len/2nd Weight Sex Delivery Anes PTL Lv  1 Current               Social History   Socioeconomic History  . Marital status: Single    Spouse name: None  . Number of children: None  . Years of education: None  . Highest education level: None  Social Needs  . Financial resource strain: None  . Food insecurity - worry: None  . Food insecurity - inability: None  . Transportation needs - medical: None  . Transportation needs - non-medical: None  Occupational History  . None  Tobacco Use  . Smoking status: Never Smoker  . Smokeless tobacco: Never Used  Substance and Sexual Activity  . Alcohol use: No  . Drug use: No   . Sexual activity: Yes    Partners: Male    Birth control/protection: None  Other Topics Concern  . None  Social History Narrative   ** Merged History Encounter **        Family History  Problem Relation Age of Onset  . Diabetes Maternal Grandfather     Medications Prior to Admission  Medication Sig Dispense Refill Last Dose  . Doxylamine-Pyridoxine (DICLEGIS) 10-10 MG TBEC Take 2 tablets by mouth at bedtime. If symptoms persist, add one tablet in the morning and one in the afternoon 100 tablet 5 10/29/2017 at Unknown time  . Prenatal Vit-Fe Fumarate-FA (PRENATAL MULTIVITAMIN) TABS tablet Take 1 tablet by mouth daily at 12 noon.   10/29/2017 at Unknown time  . Prenat w/o A-FeCbGl-DSS-FA-DHA (CITRANATAL ASSURE) 35-1 & 300 MG tablet Take 1 tablet by mouth daily. (Patient not taking: Reported on 10/30/2017) 30 tablet 4 Not Taking at Unknown time    Allergies  Allergen Reactions  . Gluten Meal   . Lactose Intolerance (Gi)     Review of Systems: Negative except for what is mentioned in HPI.  Physical Exam: BP 125/80 (BP Location: Left Arm)   Pulse 86   Temp 98.1 F (36.7 C) (Oral)   Resp 16  Ht 5\' 6"  (1.676 m)   Wt 176 lb (79.8 kg)   LMP 01/22/2017 (Exact Date)   BMI 28.41 kg/m  GENERAL: Well-developed, well-nourished female in no acute distress.  LUNGS: Clear to auscultation bilaterally.  HEART: Regular rate and rhythm. ABDOMEN: Soft, nontender, nondistended, gravid. EXTREMITIES: Nontender, no edema, 2+ distal pulses. Cervical Exam: Dilation: 1 Effacement (%): 70 Station: -1 Exam by:: [employer] CNM FHT:  Baseline rate 138 bpm   Variability moderate  Accelerations present   Decelerations none Contractions: Every 5-9 mins   Pertinent Labs/Studies:   No results found for this or any previous visit (from the past 24 hour(s)).  Assessment : Stephanie May is a 4 y.o. G1P0 at [redacted]w[redacted]d being admitted for induction of labor.  Plan: Labor: Expectant management.   Induction/Augmentation as needed, per protocol first dose cytotec placed. FWB: Reassuring fetal heart tracing.  GBS negative Delivery plan: Hopeful for vaginal delivery   , CNM [facility]

## 2017-10-30 NOTE — Progress Notes (Signed)
Jacqulynn CadetCaroline M Rader is a 22 y.o. G1P0 at 429w0d by LMP admitted for induction of labor due to Poor fetal growth.  Subjective:  Reports mild to moderate crampng Objective: BP 118/70 (BP Location: Left Arm)   Pulse 62   Temp 98.2 F (36.8 C) (Oral)   Resp 16   Ht 5\' 6"  (1.676 m)   Wt 176 lb (79.8 kg)   LMP 01/22/2017 (Exact Date)   BMI 28.41 kg/m  No intake/output data recorded. No intake/output data recorded.  FHT:  FHR: 148 bpm, variability: moderate,  accelerations:  Present,  decelerations:  Absent UC:   irregular, every 2-4 minutes, mild to palpation SVE:   Dilation: 1 Effacement (%): 90 Station: -1 Exam by:: Jeny Nield, CNM  Labs: Lab Results  Component Value Date   WBC 7.0 10/30/2017   HGB 12.3 10/30/2017   HCT 35.4 10/30/2017   MCV 89.7 10/30/2017   PLT 158 10/30/2017    Assessment / Plan: IOL  Labor: second dose cytotec placed Preeclampsia:  labs stable Fetal Wellbeing:  Category I Pain Control:  Labor support without medications I/D:  n/a Anticipated MOD:  NSVD  Kaveh Kissinger N Marchelle Rinella 10/30/2017, 2:48 PM

## 2017-10-30 NOTE — Progress Notes (Signed)
Stephanie CadetCaroline M May is a 22 y.o. G1P0 at 2642w0d by LMP admitted for induction of labor due to Poor fetal growth.  Subjective: Reports moderate cramping with contractions- relieved with IV pain   Objective: BP 124/75 (BP Location: Left Arm)   Pulse 62   Temp 98.5 F (36.9 C) (Oral)   Resp 16   Ht 5\' 6"  (1.676 m)   Wt 176 lb (79.8 kg)   LMP 01/22/2017 (Exact Date)   BMI 28.41 kg/m  I/O last 3 completed shifts: In: 13 [I.V.:13] Out: -  No intake/output data recorded.  FHT:  FHR: 144 bpm, variability: moderate,  accelerations:  Present,  decelerations:  Absent UC:   regular, every 1-2 minutes SVE:   Dilation: 2.5 Effacement (%): 90 Station: -1 Exam by:: [employer] RN  Labs: Lab Results  Component Value Date   WBC 7.0 10/30/2017   HGB 12.3 10/30/2017   HCT 35.4 10/30/2017   MCV 89.7 10/30/2017   PLT 158 10/30/2017    Assessment / Plan: cytotec IOL progressing slowing, 3rd dose cytotec placed  Labor: Progressing normally Preeclampsia:  labs stable Fetal Wellbeing:  Category I Pain Control:  IV pain meds I/D:  n/a Anticipated MOD:  NSVD  [name] 10/30/2017, 7:20 PM

## 2017-10-31 ENCOUNTER — Inpatient Hospital Stay: Payer: BLUE CROSS/BLUE SHIELD | Admitting: Certified Registered Nurse Anesthetist

## 2017-10-31 DIAGNOSIS — O365930 Maternal care for other known or suspected poor fetal growth, third trimester, not applicable or unspecified: Principal | ICD-10-CM

## 2017-10-31 DIAGNOSIS — Z3A39 39 weeks gestation of pregnancy: Secondary | ICD-10-CM

## 2017-10-31 LAB — CBC
HCT: 36.3 % (ref 35.0–47.0)
Hemoglobin: 12.6 g/dL (ref 12.0–16.0)
MCH: 31.1 pg (ref 26.0–34.0)
MCHC: 34.8 g/dL (ref 32.0–36.0)
MCV: 89.6 fL (ref 80.0–100.0)
Platelets: 160 10*3/uL (ref 150–440)
RBC: 4.05 MIL/uL (ref 3.80–5.20)
RDW: 13.3 % (ref 11.5–14.5)
WBC: 10.6 10*3/uL (ref 3.6–11.0)

## 2017-10-31 LAB — RPR: RPR Ser Ql: NONREACTIVE

## 2017-10-31 MED ORDER — DIPHENHYDRAMINE HCL 25 MG PO CAPS: 25.0000 mg | ORAL_CAPSULE | Freq: Four times a day (QID) | ORAL | Status: DC | PRN

## 2017-10-31 MED ORDER — FENTANYL 2.5 MCG/ML W/ROPIVACAINE 0.15% IN NS 100 ML EPIDURAL (ARMC)
EPIDURAL | Status: AC
  Filled 2017-10-31: qty 100

## 2017-10-31 MED ORDER — SIMETHICONE 80 MG PO CHEW: 80.0000 mg | CHEWABLE_TABLET | ORAL | Status: DC | PRN

## 2017-10-31 MED ORDER — DOCUSATE SODIUM 100 MG PO CAPS
100.0000 mg | ORAL_CAPSULE | Freq: Two times a day (BID) | ORAL | Status: DC
  Administered 2017-10-31 – 2017-11-02 (×4): 100 mg via ORAL
  Filled 2017-10-31 (×4): qty 1

## 2017-10-31 MED ORDER — IBUPROFEN 600 MG PO TABS
600.0000 mg | ORAL_TABLET | Freq: Four times a day (QID) | ORAL | Status: DC
  Administered 2017-10-31 – 2017-11-02 (×8): 600 mg via ORAL
  Filled 2017-10-31 (×8): qty 1

## 2017-10-31 MED ORDER — DIBUCAINE 1 % RE OINT: 1.0000 "application " | TOPICAL_OINTMENT | RECTAL | Status: DC | PRN

## 2017-10-31 MED ORDER — FENTANYL 2.5 MCG/ML W/ROPIVACAINE 0.15% IN NS 100 ML EPIDURAL (ARMC)
EPIDURAL | Status: DC | PRN
  Administered 2017-10-31: 12 mL/h via EPIDURAL

## 2017-10-31 MED ORDER — BUPIVACAINE HCL (PF) 0.25 % IJ SOLN
INTRAMUSCULAR | Status: DC | PRN
  Administered 2017-10-31: 5 mL via EPIDURAL
  Administered 2017-10-31: 4 mL via EPIDURAL

## 2017-10-31 MED ORDER — PRENATAL MULTIVITAMIN CH
1.0000 | ORAL_TABLET | Freq: Every day | ORAL | Status: DC
  Administered 2017-11-01 – 2017-11-02 (×2): 1 via ORAL
  Filled 2017-10-31 (×2): qty 1

## 2017-10-31 MED ORDER — LIDOCAINE HCL (PF) 1 % IJ SOLN
INTRAMUSCULAR | Status: DC | PRN
  Administered 2017-10-31: 3 mL

## 2017-10-31 MED ORDER — ONDANSETRON HCL 4 MG PO TABS: 4.0000 mg | ORAL_TABLET | ORAL | Status: DC | PRN

## 2017-10-31 MED ORDER — BENZOCAINE-MENTHOL 20-0.5 % EX AERO: 1.0000 "application " | INHALATION_SPRAY | CUTANEOUS | Status: DC | PRN

## 2017-10-31 MED ORDER — ACETAMINOPHEN 325 MG PO TABS: 650.0000 mg | ORAL_TABLET | ORAL | Status: DC | PRN

## 2017-10-31 MED ORDER — LIDOCAINE-EPINEPHRINE (PF) 1.5 %-1:200000 IJ SOLN
INTRAMUSCULAR | Status: DC | PRN
  Administered 2017-10-31: 3 mL via PERINEURAL

## 2017-10-31 MED ORDER — ONDANSETRON HCL 4 MG/2ML IJ SOLN: 4.0000 mg | INTRAMUSCULAR | Status: DC | PRN

## 2017-10-31 MED ORDER — WITCH HAZEL-GLYCERIN EX PADS: 1.0000 "application " | MEDICATED_PAD | CUTANEOUS | Status: DC | PRN

## 2017-10-31 MED ORDER — COCONUT OIL OIL
1.0000 "application " | TOPICAL_OIL | Status: DC | PRN
  Administered 2017-11-01: 1 via TOPICAL

## 2017-10-31 NOTE — Lactation Note (Signed)
This note was copied from a baby's chart. Lactation Consultation Note  Patient Name: Girl Eyvonne LeftCaroline Barrero ZOXWR'UToday's Date: 10/31/2017 Reason for consult: Initial assessment   Maternal Data Formula Feeding for Exclusion: No  Feeding Feeding Type: Breast Fed Length of feed: 30 min  LATCH Score Latch: Grasps breast easily, tongue down, lips flanged, rhythmical sucking.  Audible Swallowing: Spontaneous and intermittent  Type of Nipple: Everted at rest and after stimulation  Comfort (Breast/Nipple): Soft / non-tender  Hold (Positioning): Full assist, staff holds infant at breast  LATCH Score: 8  Interventions Interventions: Breast feeding basics reviewed;Assisted with latch  Lactation Tools Discussed/Used     Consult Status Consult Status: PRN Follow-up type: In-patient    Trudee GripCarolyn P Goble Fudala 10/31/2017, 2:58 PM

## 2017-10-31 NOTE — Progress Notes (Signed)
Stephanie May is a 22 y.o. G1P0 at 8339w1d by LMP admitted for induction of labor due to Poor fetal growth.  Subjective: restsed slightly through the night, feels some mild cramping in here back  Objective: BP 125/78 (BP Location: Left Arm)   Pulse 75   Temp 98.2 F (36.8 C) (Oral)   Resp 14   Ht 5\' 6"  (1.676 m)   Wt 176 lb (79.8 kg)   LMP 01/22/2017 (Exact Date)   BMI 28.41 kg/m  I/O last 3 completed shifts: In: 198.4 [I.V.:198.4] Out: -  No intake/output data recorded.  FHT:  FHR: 155 bpm, variability: moderate,  accelerations:  Present,  decelerations:  Absent UC:   irregular, every 2-9 minutes SVE:   Dilation: 3 Effacement (%): 90 Station: -1 Exam by:: [employer]Sh[employer]y, CNM  Labs: Lab Results  Component Value Date   WBC 7.0 10/30/2017   HGB 12.3 10/30/2017   HCT 35.4 10/30/2017   MCV 89.7 10/30/2017   PLT 158 10/30/2017    Assessment / Plan: Induction of labor due to IUGR,  progressing well on pitocin, will repeat cbc this am  Labor: Progressing normally Preeclampsia:  labs stable Fetal Wellbeing:  Category I Pain Control:  Labor support without medications I/D:  n/a Anticipated MOD:  NSVD  [name] Robert Sperl 10/31/2017, 7:35 AM

## 2017-10-31 NOTE — Anesthesia Preprocedure Evaluation (Signed)
Anesthesia Evaluation  Patient identified by MRN, date of birth, ID band Patient awake    Reviewed: Allergy & Precautions, H&P , NPO status , Patient's Chart, lab work & pertinent test results  Airway Mallampati: II       Dental  (+) Teeth Intact   Pulmonary neg pulmonary ROS,    Pulmonary exam normal breath sounds clear to auscultation       Cardiovascular Exercise Tolerance: Good negative cardio ROS Normal cardiovascular exam Rhythm:regular     Neuro/Psych negative neurological ROS  negative psych ROS   GI/Hepatic Neg liver ROS, GERD  Poorly Controlled,  Endo/Other  negative endocrine ROS  Renal/GU negative Renal ROS  negative genitourinary   Musculoskeletal   Abdominal   Peds  Hematology negative hematology ROS (+)   Anesthesia Other Findings   Reproductive/Obstetrics (+) Pregnancy                             Anesthesia Physical Anesthesia Plan  ASA: II  Anesthesia Plan: Epidural   Post-op Pain Management:    Induction:   PONV Risk Score and Plan:   Airway Management Planned:   Additional Equipment:   Intra-op Plan:   Post-operative Plan:   Informed Consent: I have reviewed the patients History and Physical, chart, labs and discussed the procedure including the risks, benefits and alternatives for the proposed anesthesia with the patient or authorized representative who has indicated his/her understanding and acceptance.     Plan Discussed with: Anesthesiologist  Anesthesia Plan Comments:         Anesthesia Quick Evaluation

## 2017-10-31 NOTE — Anesthesia Procedure Notes (Signed)
Epidural Patient location during procedure: OB Start time: 10/31/2017 9:56 AM End time: 10/31/2017 10:09 AM  Staffing Anesthesiologist: Naomie DeanKephart, William K, MD Resident/CRNA: Ginger CarneMichelet, Ellagrace Yoshida, CRNA Performed: resident/CRNA   Preanesthetic Checklist Completed: patient identified, site marked, surgical consent, pre-op evaluation, timeout performed, IV checked, risks and benefits discussed and monitors and equipment checked  Epidural Patient position: sitting Prep: Betadine Patient monitoring: heart rate, continuous pulse ox and blood pressure Approach: midline Location: L4-L5 Injection technique: LOR saline  Needle:  Needle type: Tuohy  Needle gauge: 17 G Needle length: 9 cm and 9 Needle insertion depth: 6 cm Catheter type: closed end flexible Catheter size: 19 Gauge Catheter at skin depth: 11 cm Test dose: negative and 1.5% lidocaine with Epi 1:200 K  Assessment Sensory level: T10 Events: blood not aspirated, injection not painful, no injection resistance, negative IV test and no paresthesia  Additional Notes Pt. Evaluated and documentation done after procedure finished. Patient identified. Risks/Benefits/Options discussed with patient including but not limited to bleeding, infection, nerve damage, paralysis, failed block, incomplete pain control, headache, blood pressure changes, nausea, vomiting, reactions to medication both or allergic, itching and postpartum back pain. Confirmed with bedside nurse the patient's most recent platelet count. Confirmed with patient that they are not currently taking any anticoagulation, have any bleeding history or any family history of bleeding disorders. Patient expressed understanding and wished to proceed. All questions were answered. Sterile technique was used throughout the entire procedure. Please see nursing notes for vital signs. Test dose was given through epidural catheter and negative prior to continuing to dose epidural or start  infusion. Warning signs of high block given to the patient including shortness of breath, tingling/numbness in hands, complete motor block, or any concerning symptoms with instructions to call for help. Patient was given instructions on fall risk and not to get out of bed. All questions and concerns addressed with instructions to call with any issues or inadequate analgesia.   Patient tolerated the insertion well without immediate complications.Reason for block:procedure for pain

## 2017-10-31 NOTE — Progress Notes (Signed)
Stephanie May is a 22 y.o. G1P0 at 2333w1d by LMP admitted for induction of labor due to Poor fetal growth.  Subjective: Sitting up for epidural  Objective: BP (!) 152/90   Pulse (!) 106   Temp 97.8 F (36.6 C) (Oral)   Resp 14   Ht 5\' 6"  (1.676 m)   Wt 176 lb (79.8 kg)   LMP 01/22/2017 (Exact Date)   SpO2 99%   BMI 28.41 kg/m  I/O last 3 completed shifts: In: 198.4 [I.V.:198.4] Out: -  No intake/output data recorded.  FHT:  FHR: 150 bpm, variability: moderate,  accelerations:  Present,  decelerations:  Absent UC:   regular, every 3 minutes on pitocin SVE:   Dilation: 4.5 Effacement (%): 100 Station: 0 Exam by:: [employer] RN  Labs: Lab Results  Component Value Date   WBC 10.6 10/31/2017   HGB 12.6 10/31/2017   HCT 36.3 10/31/2017   MCV 89.6 10/31/2017   PLT 160 10/31/2017    Assessment / Plan: Induction of labor due to IUGR,  progressing well on pitocin  Labor: Progressing normally Preeclampsia:  labs stable Fetal Wellbeing:  Category I Pain Control:  Epidural I/D:  n/a Anticipated MOD:  NSVD  [name] 10/31/2017, 10:06 AM

## 2017-11-01 LAB — CBC
HCT: 32.3 % — ABNORMAL LOW (ref 35.0–47.0)
Hemoglobin: 11.2 g/dL — ABNORMAL LOW (ref 12.0–16.0)
MCH: 31.3 pg (ref 26.0–34.0)
MCHC: 34.5 g/dL (ref 32.0–36.0)
MCV: 90.5 fL (ref 80.0–100.0)
Platelets: 144 10*3/uL — ABNORMAL LOW (ref 150–440)
RBC: 3.57 MIL/uL — ABNORMAL LOW (ref 3.80–5.20)
RDW: 13.5 % (ref 11.5–14.5)
WBC: 8.5 10*3/uL (ref 3.6–11.0)

## 2017-11-01 NOTE — Lactation Note (Signed)
This note was copied from a baby's chart. Lactation Consultation Note  Patient Name: Stephanie May RUEAV'WToday's Date: 11/01/2017 Reason for consult: Follow-up assessment;Mother's request   Maternal Data    Feeding Feeding Type: Breast Fed Length of feed: 5 min  LATCH Score Latch: Repeated attempts needed to sustain latch, nipple held in mouth throughout feeding, stimulation needed to elicit sucking reflex.  Audible Swallowing: A few with stimulation  Type of Nipple: Everted at rest and after stimulation  Comfort (Breast/Nipple): Filling, red/small blisters or bruises, mild/mod discomfort  Hold (Positioning): Assistance needed to correctly position infant at breast and maintain latch.  LATCH Score: 6  Interventions Interventions: Comfort gels;Coconut oil  Lactation Tools Discussed/Used WIC Program: No   Consult Status Consult Status: Follow-up Baby is hard to wake up for feedings, but latches on great. At times, her bottom lip needs to be uncurled, but has a great latch afterwards. Baby is spitty and LC told parents her feedings would not be long until amniotic fluid is out.    Burnadette PeterJaniya M Jadarrius May 11/01/2017, 12:58 PM

## 2017-11-01 NOTE — Plan of Care (Signed)
Vs stable; up ad lib; taking motrin for pain control; breastfeeding well with some assistance needed with positioning/latching

## 2017-11-01 NOTE — Progress Notes (Signed)
Post Partum Day 1 Subjective: no complaints, up ad lib and voiding  Objective: Blood pressure 98/61, pulse 70, temperature 98 F (36.7 C), temperature source Oral, resp. rate 18, height 5\' 6"  (1.676 m), weight 176 lb (79.8 kg), last menstrual period 01/22/2017, SpO2 99 %, unknown if currently breastfeeding.  Physical Exam:  General: alert, cooperative, appears stated age and fatigued Lochia: appropriate Uterine Fundus: firm Incision: na DVT Evaluation: No evidence of DVT seen on physical exam.  Recent Labs    10/31/17 0750 11/01/17 0554  HGB 12.6 11.2*  HCT 36.3 32.3*    Assessment/Plan: Plan for discharge tomorrow and Breastfeeding Infant feeding Breast    LOS: 2 days   [name] 11/01/2017, 8:31 PM

## 2017-11-01 NOTE — Anesthesia Postprocedure Evaluation (Signed)
Anesthesia Post Note  Patient: Stephanie CadetCaroline M Proffit  Procedure(s) Performed: AN AD HOC LABOR EPIDURAL  Patient location during evaluation: Mother Baby Anesthesia Type: Epidural Level of consciousness: awake, awake and alert and oriented Pain management: pain level controlled Vital Signs Assessment: post-procedure vital signs reviewed and stable Respiratory status: spontaneous breathing Cardiovascular status: blood pressure returned to baseline Postop Assessment: no headache, no apparent nausea or vomiting and no backache Anesthetic complications: no     Last Vitals:  Vitals:   10/31/17 2322 11/01/17 0434  BP: 112/66 107/62  Pulse: (!) 57 61  Resp: 18 16  Temp: 36.8 C 36.5 C  SpO2:  100%    Last Pain:  Vitals:   11/01/17 0434  TempSrc: Oral  PainSc:                  Karoline Caldwelleana Ty Buntrock

## 2017-11-01 NOTE — Lactation Note (Signed)
This note was copied from a baby's chart. Lactation Consultation Note  Patient Name: Stephanie May AVWUJ'WToday's Date: 11/01/2017 Reason for consult: Mother's request   Maternal Data Has patient been taught Hand Expression?: Yes  Feeding Feeding Type: Breast Fed Length of feed: 10 min  LATCH Score Latch: Grasps breast easily, tongue down, lips flanged, rhythmical sucking.  Audible Swallowing: A few with stimulation  Type of Nipple: Everted at rest and after stimulation  Comfort (Breast/Nipple): Filling, red/small blisters or bruises, mild/mod discomfort  Hold (Positioning): Assistance needed to correctly position infant at breast and maintain latch.  LATCH Score: 7  Interventions    Lactation Tools Discussed/Used     Consult Status Consult Status: Follow-up Date: 11/02/17    Burnadette PeterJaniya M Rinnah Peppel 11/01/2017, 4:27 PM

## 2017-11-02 ENCOUNTER — Encounter: Payer: Self-pay | Admitting: Obstetrics and Gynecology

## 2017-11-02 MED ORDER — VARICELLA VIRUS VACCINE LIVE 1350 PFU/0.5ML IJ SUSR
0.5000 mL | Freq: Once | INTRAMUSCULAR | Status: AC
  Administered 2017-11-02: 0.5 mL via SUBCUTANEOUS
  Filled 2017-11-02: qty 0.5

## 2017-11-02 MED ORDER — IBUPROFEN 800 MG PO TABS: 800.0000 mg | ORAL_TABLET | Freq: Three times a day (TID) | ORAL | 1 refills | Status: DC | PRN

## 2017-11-02 NOTE — Progress Notes (Signed)
Discharge instructions given. Patient verbalizes understanding of teaching. Patient discharged home at 1500.

## 2017-11-02 NOTE — Progress Notes (Addendum)
Post Partum Day # 2, s/p SVD  Subjective: no complaints, up ad lib, voiding and tolerating PO  Objective: Vitals:   11/01/17 2116 11/02/17 0452 11/02/17 0453 11/02/17 0820  BP:  101/68  109/62  Pulse: 62 (!) 55 60 (!) 59  Resp:  18    Temp: 98.3 F (36.8 C) 98 F (36.7 C)  98.1 F (36.7 C)  TempSrc: Axillary Oral  Oral  SpO2:  100%  100%  Weight:      Height:        Physical Exam:  General: alert and no distress  Lungs: clear to auscultation bilaterally Breasts: normal appearance, no masses or tenderness Heart: regular rate and rhythm, S1, S2 normal, no murmur, click, rub or gallop Abdomen: soft, non-tender; bowel sounds normal; no masses,  no organomegaly Pelvis: Lochia: appropriate, Uterine Fundus: firm Extremities: DVT Evaluation: No evidence of DVT seen on physical exam. Negative Homan's sign. No cords or calf tenderness. No significant calf/ankle edema.  Recent Labs    10/31/17 0750 11/01/17 0554  HGB 12.6 11.2*  HCT 36.3 32.3*    Assessment/Plan: Doing well postpartum.  Breastfeeding  Contraception undecided. Will discuss further at 6 week postpartum appointment.  Varicella non-immune, will give vaccine prior to discharge. Discharge home    LOS: 3 days   Hildred Laserherry, Charletta Voight, MD [facility] 11/02/2017 11:55 AM

## 2017-11-02 NOTE — Lactation Note (Signed)
This note was copied from a baby's chart. Lactation Consultation Note  Patient Name: Stephanie May Today's Date: 11/02/2017     Maternal Data  Wants to purchase a Medela Advanced pump and style.  She has decided to pump only and feed baby expressed breast milk, she has been pumping approx 3 oz of colostrum with DEBP.  I processed payment of credit card and gave mom receipt and the pump.  She states she can file this with her insurance  Feeding Feeding Type: Bottle Fed - Breast Milk Nipple Type: Slow - flow  LATCH Score                   Interventions      Lactation Tools Discussed/Used     Consult Status      Dyann KiefMarsha D Azrielle Springsteen 11/02/2017, 3:03 PM

## 2017-11-02 NOTE — Discharge Summary (Signed)
Obstetric Discharge Summary Reason for Admission: induction of labor for growth restriction Prenatal Procedures: NST and ultrasound Intrapartum Procedures: spontaneous vaginal delivery Postpartum Procedures: Varicella IG Complications-Operative and Postpartum: none Hemoglobin  Date Value Ref Range Status  11/01/2017 11.2 (L) 12.0 - 16.0 g/dL Final  16/10/960410/23/2018 54.011.8 11.1 - 15.9 g/dL Final   HCT  Date Value Ref Range Status  11/01/2017 32.3 (L) 35.0 - 47.0 % Final   Hematocrit  Date Value Ref Range Status  08/13/2017 33.9 (L) 34.0 - 46.6 % Final    Physical Exam:  Vitals:   11/01/17 2116 11/02/17 0452 11/02/17 0453 11/02/17 0820  BP:  101/68  109/62  Pulse: 62 (!) 55 60 (!) 59  Resp:  18    Temp: 98.3 F (36.8 C) 98 F (36.7 C)  98.1 F (36.7 C)  TempSrc: Axillary Oral  Oral  SpO2:  100%  100%  Weight:      Height:        General: alert and no distress Lochia: appropriate Uterine Fundus: firm Incision: none DVT Evaluation: No evidence of DVT seen on physical exam. Negative Homan's sign. No cords or calf tenderness. No significant calf/ankle edema.  Discharge Diagnoses: Term Pregnancy-delivered and Growth restriction  Discharge Information: Date: 11/02/2017 Activity: pelvic rest Diet: routine Medications: PNV and Ibuprofen Condition: stable Instructions: refer to practice specific booklet Discharge to: home Follow-up Information    [name], Stephanie PedMelody May, CNM. Schedule an appointment as soon as possible for a visit in 6 week(s).   Specialties:  Obstetrics and Gynecology, Radiology Why:  Postpartum visit Contact information: [address] [place] [address] [phone]           Newborn Data: Live born female  Birth Weight: 6 lb 10.9 oz (3030 g) APGAR: 8, 9  Newborn Delivery   Birth date/time:  10/31/2017 11:16:00 Delivery type:  Vaginal, Spontaneous     Home with mother.  Stephanie May 11/02/2017, 12:03 PM

## 2017-11-17 DIAGNOSIS — Z79899 Other long term (current) drug therapy: Secondary | ICD-10-CM | POA: Diagnosis not present

## 2017-11-17 DIAGNOSIS — N61 Mastitis without abscess: Secondary | ICD-10-CM | POA: Diagnosis not present

## 2017-11-17 DIAGNOSIS — E860 Dehydration: Secondary | ICD-10-CM | POA: Diagnosis not present

## 2017-11-17 DIAGNOSIS — R42 Dizziness and giddiness: Secondary | ICD-10-CM | POA: Diagnosis present

## 2017-11-18 ENCOUNTER — Encounter: Payer: Self-pay | Admitting: Emergency Medicine

## 2017-11-18 ENCOUNTER — Emergency Department
Admission: EM | Admit: 2017-11-18 | Discharge: 2017-11-18 | Disposition: A | Discharge status: Home / Self Care | Payer: BLUE CROSS/BLUE SHIELD | Attending: Emergency Medicine | Admitting: Emergency Medicine

## 2017-11-18 ENCOUNTER — Other Ambulatory Visit: Payer: Self-pay

## 2017-11-18 DIAGNOSIS — N61 Mastitis without abscess: Secondary | ICD-10-CM

## 2017-11-18 DIAGNOSIS — E860 Dehydration: Secondary | ICD-10-CM

## 2017-11-18 MED ORDER — IBUPROFEN 600 MG PO TABS
600.0000 mg | ORAL_TABLET | Freq: Once | ORAL | Status: AC
  Administered 2017-11-18: 600 mg via ORAL
  Filled 2017-11-18: qty 1

## 2017-11-18 NOTE — ED Provider Notes (Signed)
[facility] Emergency Department Provider Note  ____________________________________________   First MD Initiated Contact with Patient 11/18/17 0101     (approximate)  I have reviewed the triage vital signs and the nursing notes.   HISTORY  Chief Complaint Dizziness   HPI Stephanie May is a 22 y.o. female who self presents to the emergency department with several days of "dizziness" and lightheadedness.  She gave birth 2 weeks ago to a healthy full-term baby.  She subsequently has been breast-feeding and 2 days ago developed left-sided mastitis for which she began taking biotics last night.  She denies vertigo.  She denies room spinning.  She denies nausea or vomiting.  Her symptoms seem to be worse when she stands up suddenly.  She feels dehydrated.  Her symptoms are improved with rest.  Her dizziness is mild to moderate.  Past Medical History:  Diagnosis Date  . Chlamydia   . History of chlamydia infection 2 9 16   . Irregular menses   . Medical history non-contributory   . PCOS (polycystic ovarian syndrome)   . Pelvic pain in female   . Primary dysmenorrhea   . Sexually transmitted disease (STD)    previous h/o  . Unexplained weight gain     Patient Active Problem List   Diagnosis Date Noted  . IUGR (intrauterine growth restriction) affecting care of mother 10/30/2017  . Intrauterine growth restriction affecting antepartum care of mother 10/09/2017  . Pregnancy 08/31/2017  . Maternal varicella, non-immune 04/02/2017    Past Surgical History:  Procedure Laterality Date  . NO PAST SURGERIES      Prior to Admission medications   Medication Sig Start Date End Date Taking? Authorizing Provider  ibuprofen (ADVIL,MOTRIN) 800 MG tablet Take 1 tablet (800 mg total) by mouth every 8 (eight) hours as needed. 11/02/17  Yes Hildred Laser, MD  Prenatal Vit-Fe Fumarate-FA (PRENATAL MULTIVITAMIN) TABS tablet Take 1 tablet by mouth daily at 12 noon.    Yes [provider]    Allergies Gluten meal and Lactose intolerance (gi)  Family History  Problem Relation Age of Onset  . Diabetes Maternal Grandfather     Social History Social History   Tobacco Use  . Smoking status: Never Smoker  . Smokeless tobacco: Never Used  Substance Use Topics  . Alcohol use: No  . Drug use: No    Review of Systems Constitutional: Positive for fever Eyes: No visual changes. ENT: No sore throat. Cardiovascular: Denies chest pain. Respiratory: Denies shortness of breath. Gastrointestinal: No abdominal pain.  No nausea, no vomiting.  No diarrhea.  No constipation. Genitourinary: Negative for dysuria. Musculoskeletal: Negative for back pain. Skin: Negative for rash. Neurological: Negative for headaches, focal weakness or numbness.   ____________________________________________   PHYSICAL EXAM:  VITAL SIGNS: ED Triage Vitals  Enc Vitals Group     BP 11/17/17 2355 106/65     Pulse Rate 11/17/17 2355 77     Resp 11/17/17 2355 20     Temp 11/17/17 2355 98.7 F (37.1 C)     Temp Source 11/17/17 2355 Oral     SpO2 11/17/17 2355 98 %     Weight --      Height --      Head Circumference --      Peak Flow --      Pain Score 11/18/17 0017 4     Pain Loc --      Pain Edu? --      Excl.  in GC? --     Constitutional: Alert and oriented x4 appropriate cooperative speaks in full clear sentences no diaphoresis Eyes: PERRL EOMI. no nystagmus Head: Atraumatic.  Normal tympanic membranes bilaterally Nose: No congestion/rhinnorhea. Mouth/Throat: No trismus Neck: No stridor.   Cardiovascular: Normal rate, regular rhythm. Grossly normal heart sounds.  Good peripheral circulation. Respiratory: Normal respiratory effort.  No retractions. Lungs CTAB and moving good air Gastrointestinal: Soft nontender Musculoskeletal: No lower extremity edema   Neurologic:  Normal speech and language. No gross focal neurologic deficits are  appreciated. Skin:  Skin is warm, dry and intact. No rash noted. Psychiatric: Mood and affect are normal. Speech and behavior are normal.    ____________________________________________   DIFFERENTIAL includes but not limited to  Dehydration, lightheaded, vertigo, mastitis ____________________________________________   LABS (all labs ordered are listed, but only abnormal results are displayed)  Labs Reviewed - No data to display   __________________________________________  EKG  ED ECG REPORT I, Merrily BrittleNeil Harley Mccartney, the attending physician, personally viewed and interpreted this ECG.  Date: 11/18/2017 EKG Time:  Rate: 72 Rhythm: normal sinus rhythm QRS Axis: normal Intervals: normal ST/T Wave abnormalities: normal Narrative Interpretation: no evidence of acute ischemia ____________________________________  RADIOLOGY   ____________________________________________   PROCEDURES  Procedure(s) performed: no  Procedures  Critical Care performed: no  Observation: no ____________________________________________   INITIAL IMPRESSION / ASSESSMENT AND PLAN / ED COURSE  Pertinent labs & imaging results that were available during my care of the patient were reviewed by me and considered in my medical decision making (see chart for details).       ----------------------------------------- 1:19 AM on 11/18/2017 -----------------------------------------  The patient is very well-appearing with a normal neuro exam.  Her symptoms are consistent with dehydration and lightheadedness and not with vertigo.  I offered her IV fluids however she declined stating she would prefer to go home and see her newborn which I think is reasonable.  I have encouraged aggressive oral hydration and to continue her antibiotics for her mastitis. ____________________________________________   FINAL CLINICAL IMPRESSION(S) / ED DIAGNOSES  Final diagnoses:  Dehydration  Mastitis       NEW MEDICATIONS STARTED DURING THIS VISIT:  Discharge Medication List as of 11/18/2017  1:19 AM       Note:  This document was prepared using Dragon voice recognition software and may include unintentional dictation errors.     Merrily Brittleifenbark, Amauris Debois, MD 11/18/17 [phone]

## 2017-11-18 NOTE — Discharge Instructions (Signed)
Please make sure you remain well-hydrated and continue taking all of your antibiotics as prescribed.  Return to the emergency department for any concerns.  It was a pleasure to take care of you today, and thank you for coming to our emergency department.  If you have any questions or concerns before leaving please ask the nurse to grab me and I'm more than happy to go through your aftercare instructions again.  If you were prescribed any opioid pain medication today such as Norco, Vicodin, Percocet, morphine, hydrocodone, or oxycodone please make sure you do not drive when you are taking this medication as it can alter your ability to drive safely.  If you have any concerns once you are home that you are not improving or are in fact getting worse before you can make it to your follow-up appointment, please do not hesitate to call 911 and come back for further evaluation.  Merrily BrittleNeil Tola Meas, MD

## 2017-11-18 NOTE — ED Triage Notes (Addendum)
Pt says she's been taking Dicloxacillin for mastitis since yesterday; pt c/o dizziness that started yesterday but has worsened since taking the medication; denies visual changes; pt says worse when she moves from sitting to standing; pt awake and alert; talking in complete coherent sentences; also has a headache to occipital area and neck; pt had baby 2 weeks ago

## 2017-12-12 ENCOUNTER — Encounter: Payer: Self-pay | Admitting: Certified Nurse Midwife

## 2017-12-12 ENCOUNTER — Encounter: Payer: BLUE CROSS/BLUE SHIELD | Admitting: Obstetrics and Gynecology

## 2017-12-12 ENCOUNTER — Ambulatory Visit (INDEPENDENT_AMBULATORY_CARE_PROVIDER_SITE_OTHER): Payer: BLUE CROSS/BLUE SHIELD | Admitting: Certified Nurse Midwife

## 2017-12-12 DIAGNOSIS — Z7251 High risk heterosexual behavior: Secondary | ICD-10-CM

## 2017-12-12 NOTE — Progress Notes (Signed)
Subjective:    Stephanie May is a 22 y.o. 841P1001 Caucasian female who presents for a postpartum visit. She is 6 weeks postpartum following a spontaneous vaginal delivery at 39+1 gestational weeks. Anesthesia: epidural. I have fully reviewed the prenatal and intrapartum course.   Postpartum course has been complicated by ER visit for dehydration. Baby's course has been uncomplicated. Baby is feeding by breast. Bleeding no bleeding. Bowel function is normal. Bladder function is normal.   Patient is sexually active. Last sexual activity: three (3) weeks ago. Contraception method is coitus interruptus. Postpartum depression screening: negative. Score: 7.  Last pap N/A.  The following portions of the patient's history were reviewed and updated as appropriate: allergies, current medications, past medical history, past surgical history and problem list.  Review of Systems  Pertinent items are noted in HPI.   Objective:   BP 105/64   Pulse 92   Ht 5\' 6"  (1.676 m)   Wt 150 lb 12.8 oz (68.4 kg)   Breastfeeding? Yes   BMI 24.34 kg/m   General:  alert, cooperative and no distress   Breasts:  deferred, no complaints  Lungs: clear to auscultation bilaterally  Heart:  regular rate and rhythm  Abdomen: soft, nontender   Vulva: normal  Vagina: normal vagina  Cervix:  closed  Corpus: Well-involuted  Adnexa:  Non-palpable   Depression screen PHQ 2/9 12/12/2017  Decreased Interest 3  Down, Depressed, Hopeless 1  PHQ - 2 Score 4  Altered sleeping 0  Tired, decreased energy 0  Change in appetite 2  Feeling bad or failure about yourself  0  Trouble concentrating 1  Moving slowly or fidgety/restless 0  Suicidal thoughts 0  PHQ-9 Score 7        Assessment:   Postpartum exam Six (6) wks s/p spontaneous vaginal birth Breastfeeding Depression screening Contraception counseling   Plan:   Plans condoms as contraception.   Reviewed red flag symptoms and when to call.   Follow up  in: 2 months for Annual Exam or earlier if needed.   Vanessa DurhamJenkins Michelle Lawhorn,CNM [facility]

## 2017-12-12 NOTE — Patient Instructions (Signed)
Preventive Care 18-39 Years, Female Preventive care refers to lifestyle choices and visits with your health care provider that can promote health and wellness. What does preventive care include?  A yearly physical exam. This is also called an annual well check.  Dental exams once or twice a year.  Routine eye exams. Ask your health care provider how often you should have your eyes checked.  Personal lifestyle choices, including: ? Daily care of your teeth and gums. ? Regular physical activity. ? Eating a healthy diet. ? Avoiding tobacco and drug use. ? Limiting alcohol use. ? Practicing safe sex. ? Taking vitamin and mineral supplements as recommended by your health care provider. What happens during an annual well check? The services and screenings done by your health care provider during your annual well check will depend on your age, overall health, lifestyle risk factors, and family history of disease. Counseling Your health care provider may ask you questions about your:  Alcohol use.  Tobacco use.  Drug use.  Emotional well-being.  Home and relationship well-being.  Sexual activity.  Eating habits.  Work and work Statistician.  Method of birth control.  Menstrual cycle.  Pregnancy history.  Screening You may have the following tests or measurements:  Height, weight, and BMI.  Diabetes screening. This is done by checking your blood sugar (glucose) after you have not eaten for a while (fasting).  Blood pressure.  Lipid and cholesterol levels. These may be checked every 5 years starting at age 66.  Skin check.  Hepatitis C blood test.  Hepatitis B blood test.  Sexually transmitted disease (STD) testing.  BRCA-related cancer screening. This may be done if you have a family history of breast, ovarian, tubal, or peritoneal cancers.  Pelvic exam and Pap test. This may be done every 3 years starting at age 40. Starting at age 59, this may be done every 5  years if you have a Pap test in combination with an HPV test.  Discuss your test results, treatment options, and if necessary, the need for more tests with your health care provider. Vaccines Your health care provider may recommend certain vaccines, such as:  Influenza vaccine. This is recommended every year.  Tetanus, diphtheria, and acellular pertussis (Tdap, Td) vaccine. You may need a Td booster every 10 years.  Varicella vaccine. You may need this if you have not been vaccinated.  HPV vaccine. If you are 69 or younger, you may need three doses over 6 months.  Measles, mumps, and rubella (MMR) vaccine. You may need at least one dose of MMR. You may also need a second dose.  Pneumococcal 13-valent conjugate (PCV13) vaccine. You may need this if you have certain conditions and were not previously vaccinated.  Pneumococcal polysaccharide (PPSV23) vaccine. You may need one or two doses if you smoke cigarettes or if you have certain conditions.  Meningococcal vaccine. One dose is recommended if you are age 27-21 years and a first-year college student living in a residence hall, or if you have one of several medical conditions. You may also need additional booster doses.  Hepatitis A vaccine. You may need this if you have certain conditions or if you travel or work in places where you may be exposed to hepatitis A.  Hepatitis B vaccine. You may need this if you have certain conditions or if you travel or work in places where you may be exposed to hepatitis B.  Haemophilus influenzae type b (Hib) vaccine. You may need this if  you have certain risk factors.  Talk to your health care provider about which screenings and vaccines you need and how often you need them. This information is not intended to replace advice given to you by your health care provider. Make sure you discuss any questions you have with your health care provider. Document Released: 12/04/2001 Document Revised: 06/27/2016  Document Reviewed: 08/09/2015 Elsevier Interactive Patient Education  [employer].

## 2017-12-13 LAB — BETA HCG QUANT (REF LAB): hCG Quant: <1 m[IU]/mL

## 2017-12-13 LAB — CBC
Hematocrit: 42.8 % (ref 34.0–46.6)
Hemoglobin: 14.8 g/dL (ref 11.1–15.9)
MCH: 30.2 pg (ref 26.6–33.0)
MCHC: 34.6 g/dL (ref 31.5–35.7)
MCV: 87 fL (ref 79–97)
Platelets: 210 10*3/uL (ref 150–379)
RBC: 4.9 x10E6/uL (ref 3.77–5.28)
RDW: 13.7 % (ref 12.3–15.4)
WBC: 5.9 10*3/uL (ref 3.4–10.8)

## 2017-12-13 LAB — VITAMIN D 25 HYDROXY (VIT D DEFICIENCY, FRACTURES): Vit D, 25-Hydroxy: 43.8 ng/mL (ref 30.0–100.0)

## 2018-01-31 ENCOUNTER — Encounter: Payer: Self-pay | Admitting: Obstetrics and Gynecology

## 2018-01-31 ENCOUNTER — Ambulatory Visit (INDEPENDENT_AMBULATORY_CARE_PROVIDER_SITE_OTHER): Payer: BLUE CROSS/BLUE SHIELD | Admitting: Obstetrics and Gynecology

## 2018-01-31 VITALS — BP 112/58 | HR 88 | Ht 66.0 in | Wt 145.5 lb

## 2018-01-31 DIAGNOSIS — Z113 Encounter for screening for infections with a predominantly sexual mode of transmission: Secondary | ICD-10-CM | POA: Diagnosis not present

## 2018-01-31 DIAGNOSIS — L732 Hidradenitis suppurativa: Secondary | ICD-10-CM | POA: Diagnosis not present

## 2018-01-31 NOTE — Progress Notes (Signed)
Subjective:     Patient ID: Jacqulynn Cadetaroline M Bargar, female   DOB: [birth year], 22 y.o.   MRN: [id number]  HPI Notices a cyst for the last two weeks, took doxycycline for 2 weeks. Typically gets waxing of that area. Has had similar areas in the past. Denies fever or changes in vaginal discharge.  Also desires STD testing as spouse has been unfaithful  Review of Systems Negative except stated above in HPI    Objective:   Physical Exam A&Ox4 Well groomed female in no distres Vitals:   01/31/18 1528  Weight: 145 lb 8 oz (66 kg)  Height: 5\' 6"  (1.676 m)  Pelvic exam: VULVA: normal appearing vulva with no masses, tenderness or lesions, mons covered with old scars and tunneling c/w hidradenitis-no active comadomes, VAGINA: normal appearing vagina with normal color and discharge, no lesions, CERVIX: normal appearing cervix without discharge or lesions.    Assessment:     Hidradenitis suppurativa Std screening    Plan:     Counseled at length about findings and treatment options. Will call if needs antibiotics in future. RTC as needed.  Fortino Haag,CNM

## 2018-01-31 NOTE — Addendum Note (Signed)
Addended by: Rosine BeatLONTZ, AMY L on: 01/31/2018 04:34 PM   Modules accepted: Orders

## 2018-01-31 NOTE — Patient Instructions (Signed)
Hidradenitis Suppurativa Hidradenitis suppurativa is a long-term (chronic) skin disease that starts with blocked sweat glands or hair follicles. Bacteria may grow in these blocked openings of your skin. Hidradenitis suppurativa is like a severe form of acne that develops in areas of your body where acne would be unusual. It is most likely to affect the areas of your body where skin rubs against skin and becomes moist. This includes your:  Underarms.  Groin.  Genital areas.  Buttocks.  Upper thighs.  Breasts.  Hidradenitis suppurativa may start out with small pimples. The pimples can develop into deep sores that break open (rupture) and drain pus. Over time your skin may thicken and become scarred. Hidradenitis suppurativa cannot be passed from person to person. What are the causes? The exact cause of hidradenitis suppurativa is not known. This condition may be due to:  Female and female hormones. The condition is rare before and after puberty.  An overactive body defense system (immune system). Your immune system may overreact to the blocked hair follicles or sweat glands and cause swelling and pus-filled sores.  What increases the risk? You may have a higher risk of hidradenitis suppurativa if you:  Are a woman.  Are between ages 11 and 55.  Have a family history of hidradenitis suppurativa.  Have a personal history of acne.  Are overweight.  Smoke.  Take the drug lithium.  What are the signs or symptoms? The first signs of an outbreak are usually painful skin bumps that look like pimples. As the condition progresses:  Skin bumps may get bigger and grow deeper into the skin.  Bumps under the skin may rupture and drain smelly pus.  Skin may become itchy and infected.  Skin may thicken and scar.  Drainage may continue through tunnels under the skin (fistulas).  Walking and moving your arms can become painful.  How is this diagnosed? Your health care provider may  diagnose hidradenitis suppurativa based on your medical history and your signs and symptoms. A physical exam will also be done. You may need to see a health care provider who specializes in skin diseases (dermatologist). You may also have tests done to confirm the diagnosis. These can include:  Swabbing a sample of pus or drainage from your skin so it can be sent to the lab and tested for infection.  Blood tests to check for infection.  How is this treated? The same treatment will not work for everybody with hidradenitis suppurativa. Your treatment will depend on how severe your symptoms are. You may need to try several treatments to find what works best for you. Part of your treatment may include cleaning and bandaging (dressing) your wounds. You may also have to take medicines, such as the following:  Antibiotics.  Acne medicines.  Medicines to block or suppress the immune system.  A diabetes medicine (metformin) is sometimes used to treat this condition.  For women, birth control pills can sometimes help relieve symptoms.  You may need surgery if you have a severe case of hidradenitis suppurativa that does not respond to medicine. Surgery may involve:  Using a laser to clear the skin and remove hair follicles.  Opening and draining deep sores.  Removing the areas of skin that are diseased and scarred.  Follow these instructions at home:  Learn as much as you can about your disease, and work closely with your health care providers.  Take medicines only as directed by your health care provider.  If you were prescribed   an antibiotic medicine, finish it all even if you start to feel better.  If you are overweight, losing weight may be very helpful. Try to reach and maintain a healthy weight.  Do not use any tobacco products, including cigarettes, chewing tobacco, or electronic cigarettes. If you need help quitting, ask your health care provider.  Do not shave the areas where you  get hidradenitis suppurativa.  Do not wear deodorant.  Wear loose-fitting clothes.  Try not to overheat and get sweaty.  Take a daily bleach bath as directed by your health care provider. ? Fill your bathtub halfway with water. ? Pour in  cup of unscented household bleach. ? Soak for 5-10 minutes.  Cover sore areas with a warm, clean washcloth (compress) for 5-10 minutes. Contact a health care provider if:  You have a flare-up of hidradenitis suppurativa.  You have chills or a fever.  You are having trouble controlling your symptoms at home. This information is not intended to replace advice given to you by your health care provider. Make sure you discuss any questions you have with your health care provider. Document Released: 05/22/2004 Document Revised: 03/15/2016 Document Reviewed: 01/08/2014 Elsevier Interactive Patient Education  2018 Elsevier Inc.  

## 2018-02-04 LAB — GC/CHLAMYDIA PROBE AMP
Chlamydia trachomatis, NAA: NEGATIVE
Neisseria gonorrhoeae by PCR: NEGATIVE

## 2018-02-10 ENCOUNTER — Encounter: Payer: BLUE CROSS/BLUE SHIELD | Admitting: Obstetrics and Gynecology

## 2018-02-20 ENCOUNTER — Ambulatory Visit (INDEPENDENT_AMBULATORY_CARE_PROVIDER_SITE_OTHER): Payer: BLUE CROSS/BLUE SHIELD | Admitting: Obstetrics and Gynecology

## 2018-02-20 ENCOUNTER — Other Ambulatory Visit: Payer: Self-pay | Admitting: Obstetrics and Gynecology

## 2018-02-20 ENCOUNTER — Encounter: Payer: Self-pay | Admitting: Obstetrics and Gynecology

## 2018-02-20 VITALS — BP 100/61 | HR 78 | Ht 66.0 in | Wt 145.3 lb

## 2018-02-20 DIAGNOSIS — Z01419 Encounter for gynecological examination (general) (routine) without abnormal findings: Secondary | ICD-10-CM | POA: Diagnosis not present

## 2018-02-20 NOTE — Patient Instructions (Signed)
Place annual gynecologic exam patient instructions here.

## 2018-02-20 NOTE — Progress Notes (Signed)
  Subjective:     Stephanie May is a white separated 22 y.o. female and is here for a comprehensive physical exam. The patient reports no problems.  Social History   Socioeconomic History  . Marital status: Single    Spouse name: Not on file  . Number of children: Not on file  . Years of education: Not on file  . Highest education level: Not on file  Occupational History  . Not on file  Social Needs  . Financial resource strain: Not on file  . Food insecurity:    Worry: Not on file    Inability: Not on file  . Transportation needs:    Medical: Not on file    Non-medical: Not on file  Tobacco Use  . Smoking status: Never Smoker  . Smokeless tobacco: Never Used  Substance and Sexual Activity  . Alcohol use: No  . Drug use: No  . Sexual activity: Not Currently    Partners: Male    Birth control/protection: None  Lifestyle  . Physical activity:    Days per week: Not on file    Minutes per session: Not on file  . Stress: Not on file  Relationships  . Social connections:    Talks on phone: Not on file    Gets together: Not on file    Attends religious service: Not on file    Active member of club or organization: Not on file    Attends meetings of clubs or organizations: Not on file    Relationship status: Not on file  . Intimate partner violence:    Fear of current or ex partner: Not on file    Emotionally abused: Not on file    Physically abused: Not on file    Forced sexual activity: Not on file  Other Topics Concern  . Not on file  Social History Narrative   ** Merged History Encounter **       Health Maintenance  Topic Date Due  . CHLAMYDIA SCREENING  12/26/2017  . PAP SMEAR  02/05/2018  . INFLUENZA VACCINE  05/22/2018  . TETANUS/TDAP  08/14/2027  . HIV Screening  Completed    The following portions of the patient's history were reviewed and updated as appropriate: allergies, current medications, past family history, past medical history, past  social history, past surgical history and problem list.  Review of Systems Pertinent items noted in HPI and remainder of comprehensive ROS otherwise negative.   Objective:    General appearance: alert, cooperative and appears stated age Neck: no adenopathy, no carotid bruit, no JVD, supple, symmetrical, trachea midline and thyroid not enlarged, symmetric, no tenderness/mass/nodules Lungs: clear to auscultation bilaterally Breasts: normal appearance, no masses or tenderness Heart: regular rate and rhythm, S1, S2 normal, no murmur, click, rub or gallop Abdomen: soft, non-tender; bowel sounds normal; no masses,  no organomegaly Pelvic: cervix normal in appearance, external genitalia normal, no adnexal masses or tenderness, no cervical motion tenderness, rectovaginal septum normal, uterus normal size, shape, and consistency and vagina normal without discharge    Assessment:    Healthy female exam.      Plan:  RTC 1 year or as needed.  Melody Shambley,CNM   See After Visit Summary for Counseling Recommendations

## 2018-02-26 LAB — CYTOLOGY - PAP

## 2018-02-28 ENCOUNTER — Telehealth: Payer: Self-pay | Admitting: *Deleted

## 2018-02-28 NOTE — Telephone Encounter (Signed)
-----   Message from Purcell Nails, [place] sent at 02/27/2018 11:36 AM EDT ----- Please mail info on colpo & LGSIL, needs to set up colpo, LM for patient to call back, has not been notified yet.

## 2018-02-28 NOTE — Telephone Encounter (Signed)
Mailed pts info

## 2018-03-20 ENCOUNTER — Encounter: Payer: Self-pay | Admitting: Obstetrics and Gynecology

## 2018-03-20 ENCOUNTER — Other Ambulatory Visit: Payer: Self-pay | Admitting: Obstetrics and Gynecology

## 2018-03-20 ENCOUNTER — Ambulatory Visit (INDEPENDENT_AMBULATORY_CARE_PROVIDER_SITE_OTHER): Payer: BLUE CROSS/BLUE SHIELD | Admitting: Obstetrics and Gynecology

## 2018-03-20 VITALS — BP 112/68 | HR 89 | Ht 66.0 in | Wt 143.5 lb

## 2018-03-20 DIAGNOSIS — R87612 Low grade squamous intraepithelial lesion on cytologic smear of cervix (LGSIL): Secondary | ICD-10-CM | POA: Diagnosis not present

## 2018-03-20 NOTE — Progress Notes (Signed)
[facility] COLPOSCOPY PROCEDURE NOTE  22 y.o. G1P1001 here for colposcopy for low-grade squamous intraepithelial neoplasia (LGSIL - encompassing HPV,mild dysplasia,CIN I) pap smear on 02/20/18. Discussed role for HPV in cervical dysplasia, need for surveillance.  Patient given informed consent, signed copy in the chart, time out was performed.  Placed in lithotomy position. Cervix viewed with speculum and colposcope after application of acetic acid.   Colposcopy adequate? Yes  acetowhite lesion(s) noted at from 10-2 o'clock & 4-8 o'clock; corresponding biopsies obtained at 10& 4.  ECC specimen obtained. All specimens were labelled and sent to pathology. See scanned colpo sheet with detailed drawing.   Patient was given post procedure instructions.  Will follow up pathology and manage accordingly.  Routine preventative health maintenance measures emphasized.     Aylee Littrell Suzan Nailer, CNM

## 2018-04-14 ENCOUNTER — Encounter: Payer: Self-pay | Admitting: Obstetrics and Gynecology

## 2018-05-12 ENCOUNTER — Encounter: Payer: Self-pay | Admitting: Obstetrics and Gynecology

## 2018-05-12 ENCOUNTER — Other Ambulatory Visit: Payer: Self-pay | Admitting: *Deleted

## 2018-05-12 MED ORDER — AZITHROMYCIN 250 MG PO TABS: 250.0000 mg | ORAL_TABLET | Freq: Every day | ORAL | 0 refills | Status: DC

## 2018-06-17 ENCOUNTER — Other Ambulatory Visit: Payer: Self-pay | Admitting: Obstetrics and Gynecology

## 2018-06-17 MED ORDER — NORGESTIM-ETH ESTRAD TRIPHASIC 0.18/0.215/0.25 MG-25 MCG PO TABS: 1.0000 | ORAL_TABLET | Freq: Every day | ORAL | 11 refills | Status: DC

## 2018-09-18 ENCOUNTER — Emergency Department
Admission: EM | Admit: 2018-09-18 | Discharge: 2018-09-18 | Disposition: A | Discharge status: Home / Self Care | Payer: BLUE CROSS/BLUE SHIELD | Attending: Student in an Organized Health Care Education/Training Program | Admitting: Student in an Organized Health Care Education/Training Program

## 2018-09-18 ENCOUNTER — Other Ambulatory Visit: Payer: Self-pay

## 2018-09-18 ENCOUNTER — Encounter: Payer: Self-pay | Admitting: Emergency Medicine

## 2018-09-18 DIAGNOSIS — Z79899 Other long term (current) drug therapy: Secondary | ICD-10-CM | POA: Insufficient documentation

## 2018-09-18 DIAGNOSIS — N3000 Acute cystitis without hematuria: Secondary | ICD-10-CM | POA: Insufficient documentation

## 2018-09-18 DIAGNOSIS — N3090 Cystitis, unspecified without hematuria: Secondary | ICD-10-CM

## 2018-09-18 DIAGNOSIS — R350 Frequency of micturition: Secondary | ICD-10-CM | POA: Diagnosis present

## 2018-09-18 LAB — URINALYSIS, COMPLETE (UACMP) WITH MICROSCOPIC
Bacteria, UA: NONE SEEN
Bilirubin Urine: NEGATIVE
Glucose, UA: NEGATIVE mg/dL
Hgb urine dipstick: NEGATIVE
Ketones, ur: NEGATIVE mg/dL
Nitrite: NEGATIVE
Protein, ur: 100 mg/dL — AB
Specific Gravity, Urine: 1.017 (ref 1.005–1.030)
WBC, UA: >50 WBC/hpf — ABNORMAL HIGH (ref 0–5)
pH: 8 (ref 5.0–8.0)

## 2018-09-18 LAB — POCT PREGNANCY, URINE: Preg Test, Ur: NEGATIVE

## 2018-09-18 MED ORDER — NITROFURANTOIN MONOHYD MACRO 100 MG PO CAPS: 100.0000 mg | ORAL_CAPSULE | Freq: Two times a day (BID) | ORAL | 0 refills | Status: AC

## 2018-09-18 NOTE — ED Triage Notes (Signed)
Pt reports that yesterday she developed frequency and burning when she urinates. Denies any discharge. She took AZO last evening and it helped but the pain came back today.

## 2018-09-18 NOTE — Discharge Instructions (Addendum)
Follow-up with your primary care provider or [facility] acute care if any continued problems.  Increase fluids.  Tylenol or ibuprofen as needed for pain or discomfort.  Begin taking your antibiotic twice a day for the next 7 days.  You may continue taking Azo-Standard as needed for urinary discomfort.

## 2018-09-18 NOTE — ED Notes (Signed)
Says dysurea and urgency since yesterday.  Has been taking azo.  No fever.no pain in flank.  Says she has had some vaginal spotting.

## 2018-09-18 NOTE — ED Provider Notes (Signed)
[facility] Emergency Department Provider Note   ____________________________________________   First MD Initiated Contact with Patient 09/18/18 1009     (approximate)  I have reviewed the triage vital signs and the nursing notes.   HISTORY  Chief Complaint Urinary Tract Infection    HPI Stephanie May is a 22 y.o. female presents today with complaint of sudden onset of urinary frequency and dysuria starting yesterday.  Patient states that she took Azo last evening which helped until this morning.  She has had a history of UTIs but not in the last 4 years.  She denies any vaginal discharge.  There is been no nausea, vomiting, fever or chills.  She rates her discomfort as 6 out of 10.   Past Medical History:  Diagnosis Date  . Chlamydia   . History of chlamydia infection 2 9 16   . Irregular menses   . Medical history non-contributory   . PCOS (polycystic ovarian syndrome)   . Pelvic pain in female   . Primary dysmenorrhea   . Sexually transmitted disease (STD)    previous h/o  . Unexplained weight gain     There are no active problems to display for this patient.   Past Surgical History:  Procedure Laterality Date  . NO PAST SURGERIES      Prior to Admission medications   Medication Sig Start Date End Date Taking? Authorizing Provider  nitrofurantoin, macrocrystal-monohydrate, (MACROBID) 100 MG capsule Take 1 capsule (100 mg total) by mouth 2 (two) times daily for 7 days. 09/18/18 09/25/18  Tommi RumpsSummers, Rhonda L, PA-C  Norgestimate-Ethinyl Estradiol Triphasic (ORTHO TRI-CYCLEN LO) 0.18/0.215/0.25 MG-25 MCG tab Take 1 tablet by mouth daily. 06/17/18   Shambley, Melody N, CNM    Allergies Gluten meal; Lactose intolerance (gi); and Sulfa antibiotics  Family History  Problem Relation Age of Onset  . Diabetes Maternal Grandfather     Social History Social History   Tobacco Use  . Smoking status: Never Smoker  . Smokeless tobacco:  Never Used  Substance Use Topics  . Alcohol use: No  . Drug use: No    Review of Systems Constitutional: No fever/chills Cardiovascular: Denies chest pain. Respiratory: Denies shortness of breath. Gastrointestinal: No abdominal pain.  No nausea, no vomiting.   Genitourinary: Positive for dysuria.  Negative for vaginal discharge. Musculoskeletal: Negative for back pain. Skin: Negative for rash. Neurological: Negative for headaches, focal weakness or numbness. ____________________________________________   PHYSICAL EXAM:  VITAL SIGNS: ED Triage Vitals  Enc Vitals Group     BP 09/18/18 1003 119/69     Pulse Rate 09/18/18 1003 65     Resp --      Temp 09/18/18 1003 98.3 F (36.8 C)     Temp Source 09/18/18 1003 Oral     SpO2 09/18/18 1003 98 %     Weight --      Height 09/18/18 1006 5\' 6"  (1.676 m)     Head Circumference --      Peak Flow --      Pain Score 09/18/18 1006 6     Pain Loc --      Pain Edu? --      Excl. in GC? --    Constitutional: Alert and oriented. Well appearing and in no acute distress. Eyes: Conjunctivae are normal.  Head: Atraumatic. Neck: No stridor.   Cardiovascular: Normal rate, regular rhythm. Grossly normal heart sounds.  Good peripheral circulation. Respiratory: Normal respiratory effort.  No retractions. Lungs CTAB.  Gastrointestinal: Soft and nontender. No distention.  No CVA tenderness. Musculoskeletal: Moves upper and lower extremities that any difficulty.  Normal gait was noted.  No edema noted lower extremities. Neurologic:  Normal speech and language. No gross focal neurologic deficits are appreciated.  Skin:  Skin is warm, dry and intact. No rash noted. Psychiatric: Mood and affect are normal. Speech and behavior are normal.  ____________________________________________   LABS (all labs ordered are listed, but only abnormal results are displayed)  Labs Reviewed  URINALYSIS, COMPLETE (UACMP) WITH MICROSCOPIC - Abnormal; Notable for  the following components:      Result Value   Color, Urine AMBER (*)    APPearance CLEAR (*)    Protein, ur 100 (*)    Leukocytes, UA SMALL (*)    WBC, UA >50 (*)    All other components within normal limits  POC URINE PREG, ED  POCT PREGNANCY, URINE     PROCEDURES  Procedure(s) performed: None  Procedures  Critical Care performed: No  ____________________________________________   INITIAL IMPRESSION / ASSESSMENT AND PLAN / ED COURSE  As part of my medical decision making, I reviewed the following data within the electronic MEDICAL RECORD NUMBER Notes from prior ED visits and [place] Controlled Substance Database  Patient presents to the ED with complaint of urinary frequency and dysuria that began last evening.  She is taken Azo with some temporary relief until this morning.  She has a history of UTIs with the last one being 4 years ago.  She denies any fever or chills and no CVA tenderness.  Urinalysis is consistent with an  acute cystitis.  Patient was placed on Macrobid 100 mg twice daily for 7 days.  She is encouraged to increase fluids.  She also is aware that she can continue taking Azo if she would like to.  She is to follow-up with her PCP if any continued problems.  ____________________________________________   FINAL CLINICAL IMPRESSION(S) / ED DIAGNOSES  Final diagnoses:  Cystitis     ED Discharge Orders         Ordered    nitrofurantoin, macrocrystal-monohydrate, (MACROBID) 100 MG capsule  2 times daily     09/18/18 1041           Note:  This document was prepared using Dragon voice recognition software and may include unintentional dictation errors.    Tommi Rumps, PA-C 09/18/18 1657    Willy Eddy, MD 09/19/18 [phone]

## 2018-09-25 ENCOUNTER — Encounter: Payer: BLUE CROSS/BLUE SHIELD | Admitting: Obstetrics and Gynecology

## 2018-09-30 ENCOUNTER — Encounter: Payer: BLUE CROSS/BLUE SHIELD | Admitting: Obstetrics and Gynecology

## 2018-10-14 ENCOUNTER — Other Ambulatory Visit: Payer: Self-pay

## 2018-10-14 ENCOUNTER — Ambulatory Visit (INDEPENDENT_AMBULATORY_CARE_PROVIDER_SITE_OTHER): Payer: BLUE CROSS/BLUE SHIELD | Admitting: Obstetrics and Gynecology

## 2018-10-14 DIAGNOSIS — R300 Dysuria: Secondary | ICD-10-CM

## 2018-10-14 MED ORDER — NITROFURANTOIN MONOHYD MACRO 100 MG PO CAPS: 100.0000 mg | ORAL_CAPSULE | Freq: Two times a day (BID) | ORAL | 0 refills | Status: DC

## 2018-10-14 NOTE — Progress Notes (Signed)
Pt dropped off urine due to having continuous UTI symptoms after taking AZO.

## 2018-10-15 LAB — MICROSCOPIC EXAMINATION
Casts: NONE SEEN /lpf
WBC, UA: >30 /hpf — AB (ref 0–5)

## 2018-10-15 LAB — URINALYSIS, ROUTINE W REFLEX MICROSCOPIC
Bilirubin, UA: NEGATIVE
Glucose, UA: NEGATIVE
Ketones, UA: NEGATIVE
Nitrite, UA: POSITIVE — AB
RBC, UA: NEGATIVE
Specific Gravity, UA: 1.02 (ref 1.005–1.030)
Urobilinogen, Ur: 1 mg/dL (ref 0.2–1.0)
pH, UA: 5 (ref 5.0–7.5)

## 2018-10-16 LAB — URINE CULTURE

## 2018-10-22 NOTE — L&D Delivery Note (Signed)
Delivery Note At  0824 am a viable and healthy female was delivered via  (Presentation:OA ;  ).  APGAR:8 ,9 ; weight pending skin-toskin  .   Placenta status: delivered intact with 3 vessel Cord:  with the following complications: none  Anesthesia:  none Episiotomy:  none Lacerations:  NA Suture Repair: NA Est. Blood Loss (mL):  250  Mom to postpartum.  Baby to Couplet care / Skin to Skin.  Kinneth Fujiwara N Rontae Inglett 07/05/2019, 8:42 AM

## 2018-10-29 ENCOUNTER — Ambulatory Visit (INDEPENDENT_AMBULATORY_CARE_PROVIDER_SITE_OTHER): Payer: BLUE CROSS/BLUE SHIELD | Admitting: Obstetrics and Gynecology

## 2018-10-29 ENCOUNTER — Encounter: Payer: Self-pay | Admitting: Obstetrics and Gynecology

## 2018-10-29 VITALS — BP 114/71 | HR 64 | Ht 66.0 in | Wt 145.2 lb

## 2018-10-29 DIAGNOSIS — R87612 Low grade squamous intraepithelial lesion on cytologic smear of cervix (LGSIL): Secondary | ICD-10-CM | POA: Diagnosis not present

## 2018-10-29 NOTE — Progress Notes (Signed)
  Subjective:     Patient ID: Stephanie May, female   DOB: 1996-01-09, 23 y.o.   MRN: [id number]  HPI Here for repeat pap due to previous pap of LGSIL. Denies any changes or concerns. Is sexually active with one female partner.  Review of Systems  All other systems reviewed and are negative.      Objective:   Physical Exam A&Ox4 Well groomed female in no distress Blood pressure 114/71, pulse 64, height 5\' 6"  (1.676 m), weight 145 lb 3.2 oz (65.9 kg), not currently breastfeeding. Pelvic exam: normal external genitalia, vulva, vagina, cervix, uterus and adnexa.    Assessment:     Repeat pap only H/o LGSIL    Plan:     Pap obtained RTC 6 months for physical and pap or as needed.  Tramell Piechota,CNM

## 2018-10-30 ENCOUNTER — Other Ambulatory Visit (HOSPITAL_COMMUNITY)
Admission: RE | Admit: 2018-10-30 | Discharge: 2018-10-30 | Disposition: A | Discharge status: Home / Self Care | Payer: BLUE CROSS/BLUE SHIELD | Source: Ambulatory Visit | Attending: Obstetrics and Gynecology | Admitting: Obstetrics and Gynecology

## 2018-10-30 DIAGNOSIS — R87612 Low grade squamous intraepithelial lesion on cytologic smear of cervix (LGSIL): Secondary | ICD-10-CM | POA: Insufficient documentation

## 2018-10-30 NOTE — Addendum Note (Signed)
Addended by: Lessie Dings, AMY L on: 10/30/2018 12:00 PM   Modules accepted: Orders

## 2018-10-31 LAB — CYTOLOGY - PAP: Diagnosis: NEGATIVE

## 2018-11-04 ENCOUNTER — Ambulatory Visit (INDEPENDENT_AMBULATORY_CARE_PROVIDER_SITE_OTHER): Payer: BLUE CROSS/BLUE SHIELD | Admitting: Obstetrics and Gynecology

## 2018-11-04 ENCOUNTER — Encounter: Payer: Self-pay | Admitting: Obstetrics and Gynecology

## 2018-11-04 VITALS — BP 112/68 | HR 98 | Ht 66.0 in | Wt 143.1 lb

## 2018-11-04 DIAGNOSIS — N926 Irregular menstruation, unspecified: Secondary | ICD-10-CM | POA: Diagnosis not present

## 2018-11-04 LAB — POCT URINE PREGNANCY: Preg Test, Ur: POSITIVE — AB

## 2018-11-04 LAB — PROGESTERONE: Progesterone: 18 ng/mL

## 2018-11-04 LAB — BETA HCG QUANT (REF LAB): hCG Quant: 1146 m[IU]/mL

## 2018-11-04 NOTE — Progress Notes (Signed)
  Subjective:     Patient ID: Stephanie May, female   DOB: 1996/04/11, 23 y.o.   MRN: [id number]  HPI Here for pregnancy confirmation and reports unsure LMP with h/o irregular menses. Denies any symptoms of pregnancy yet, and no menses in December. UPT + at home yesterday. Is sexually active with female partner. G1P1.  Review of Systems  All other systems reviewed and are negative.      Objective:   Physical Exam A&Ox4 Well groomed female in no distress Blood pressure 112/68, pulse 98, height 5\' 6"  (1.676 m), weight 143 lb 1.6 oz (64.9 kg), last menstrual period 09/02/2018, not currently breastfeeding. UPT+     Assessment:     Missed menses H/o of irregular menses H/o low progesterone    Plan:     Labs obtained & will follow up accordingly RTC 1-2 weeks for viability scan and nurse intake/labs. congratulated on unplanned pregnancy.   Vashaun Osmon,CNM

## 2018-11-04 NOTE — Patient Instructions (Signed)
First Trimester of Pregnancy  The first trimester of pregnancy is from week 1 until the end of week 13 (months 1 through 3). A week after a sperm fertilizes an egg, the egg will implant on the wall of the uterus. This embryo will begin to develop into a baby. Genes from you and your partner will form the baby. The female genes will determine whether the baby will be a boy or a girl. At 6-8 weeks, the eyes and face will be formed, and the heartbeat can be seen on ultrasound. At the end of 12 weeks, all the baby's organs will be formed.  Now that you are pregnant, you will want to do everything you can to have a healthy baby. Two of the most important things are to get good prenatal care and to follow your health care provider's instructions. Prenatal care is all the medical care you receive before the baby's birth. This care will help prevent, find, and treat any problems during the pregnancy and childbirth.  Body changes during your first trimester  Your body goes through many changes during pregnancy. The changes vary from woman to woman.   You may gain or lose a couple of pounds at first.   You may feel sick to your stomach (nauseous) and you may throw up (vomit). If the vomiting is uncontrollable, call your health care provider.   You may tire easily.   You may develop headaches that can be relieved by medicines. All medicines should be approved by your health care provider.   You may urinate more often. Painful urination may mean you have a bladder infection.   You may develop heartburn as a result of your pregnancy.   You may develop constipation because certain hormones are causing the muscles that push stool through your intestines to slow down.   You may develop hemorrhoids or swollen veins (varicose veins).   Your breasts may begin to grow larger and become tender. Your nipples may stick out more, and the tissue that surrounds them (areola) may become darker.   Your gums may bleed and may be  sensitive to brushing and flossing.   Dark spots or blotches (chloasma, mask of pregnancy) may develop on your face. This will likely fade after the baby is born.   Your menstrual periods will stop.   You may have a loss of appetite.   You may develop cravings for certain kinds of food.   You may have changes in your emotions from day to day, such as being excited to be pregnant or being concerned that something may go wrong with the pregnancy and baby.   You may have more vivid and strange dreams.   You may have changes in your hair. These can include thickening of your hair, rapid growth, and changes in texture. Some women also have hair loss during or after pregnancy, or hair that feels dry or thin. Your hair will most likely return to normal after your baby is born.  What to expect at prenatal visits  During a routine prenatal visit:   You will be weighed to make sure you and the baby are growing normally.   Your blood pressure will be taken.   Your abdomen will be measured to track your baby's growth.   The fetal heartbeat will be listened to between weeks 10 and 14 of your pregnancy.   Test results from any previous visits will be discussed.  Your health care provider may ask you:     How you are feeling.   If you are feeling the baby move.   If you have had any abnormal symptoms, such as leaking fluid, bleeding, severe headaches, or abdominal cramping.   If you are using any tobacco products, including cigarettes, chewing tobacco, and electronic cigarettes.   If you have any questions.  Other tests that may be performed during your first trimester include:   Blood tests to find your blood type and to check for the presence of any previous infections. The tests will also be used to check for low iron levels (anemia) and protein on red blood cells (Rh antibodies). Depending on your risk factors, or if you previously had diabetes during pregnancy, you may have tests to check for high blood sugar  that affects pregnant women (gestational diabetes).   Urine tests to check for infections, diabetes, or protein in the urine.   An ultrasound to confirm the proper growth and development of the baby.   Fetal screens for spinal cord problems (spina bifida) and Down syndrome.   HIV (human immunodeficiency virus) testing. Routine prenatal testing includes screening for HIV, unless you choose not to have this test.   You may need other tests to make sure you and the baby are doing well.  Follow these instructions at home:  Medicines   Follow your health care provider's instructions regarding medicine use. Specific medicines may be either safe or unsafe to take during pregnancy.   Take a prenatal vitamin that contains at least 600 micrograms (mcg) of folic acid.   If you develop constipation, try taking a stool softener if your health care provider approves.  Eating and drinking     Eat a balanced diet that includes fresh fruits and vegetables, whole grains, good sources of protein such as meat, eggs, or tofu, and low-fat dairy. Your health care provider will help you determine the amount of weight gain that is right for you.   Avoid raw meat and uncooked cheese. These carry germs that can cause birth defects in the baby.   Eating four or five small meals rather than three large meals a day may help relieve nausea and vomiting. If you start to feel nauseous, eating a few soda crackers can be helpful. Drinking liquids between meals, instead of during meals, also seems to help ease nausea and vomiting.   Limit foods that are high in fat and processed sugars, such as fried and sweet foods.   To prevent constipation:  ? Eat foods that are high in fiber, such as fresh fruits and vegetables, whole grains, and beans.  ? Drink enough fluid to keep your urine clear or pale yellow.  Activity   Exercise only as directed by your health care provider. Most women can continue their usual exercise routine during  pregnancy. Try to exercise for 30 minutes at least 5 days a week. Exercising will help you:  ? Control your weight.  ? Stay in shape.  ? Be prepared for labor and delivery.   Experiencing pain or cramping in the lower abdomen or lower back is a good sign that you should stop exercising. Check with your health care provider before continuing with normal exercises.   Try to avoid standing for long periods of time. Move your legs often if you must stand in one place for a long time.   Avoid heavy lifting.   Wear low-heeled shoes and practice good posture.   You may continue to have sex unless your health care   provider tells you not to.  Relieving pain and discomfort   Wear a good support bra to relieve breast tenderness.   Take warm sitz baths to soothe any pain or discomfort caused by hemorrhoids. Use hemorrhoid cream if your health care provider approves.   Rest with your legs elevated if you have leg cramps or low back pain.   If you develop varicose veins in your legs, wear support hose. Elevate your feet for 15 minutes, 3-4 times a day. Limit salt in your diet.  Prenatal care   Schedule your prenatal visits by the twelfth week of pregnancy. They are usually scheduled monthly at first, then more often in the last 2 months before delivery.   Write down your questions. Take them to your prenatal visits.   Keep all your prenatal visits as told by your health care provider. This is important.  Safety   Wear your seat belt at all times when driving.   Make a list of emergency phone numbers, including numbers for family, friends, the hospital, and police and fire departments.  General instructions   Ask your health care provider for a referral to a local prenatal education class. Begin classes no later than the beginning of month 6 of your pregnancy.   Ask for help if you have counseling or nutritional needs during pregnancy. Your health care provider can offer advice or refer you to specialists for help  with various needs.   Do not use hot tubs, steam rooms, or saunas.   Do not douche or use tampons or scented sanitary pads.   Do not cross your legs for long periods of time.   Avoid cat litter boxes and soil used by cats. These carry germs that can cause birth defects in the baby and possibly loss of the fetus by miscarriage or stillbirth.   Avoid all smoking, herbs, alcohol, and medicines not prescribed by your health care provider. Chemicals in these products affect the formation and growth of the baby.   Do not use any products that contain nicotine or tobacco, such as cigarettes and e-cigarettes. If you need help quitting, ask your health care provider. You may receive counseling support and other resources to help you quit.   Schedule a dentist appointment. At home, brush your teeth with a soft toothbrush and be gentle when you floss.  Contact a health care provider if:   You have dizziness.   You have mild pelvic cramps, pelvic pressure, or nagging pain in the abdominal area.   You have persistent nausea, vomiting, or diarrhea.   You have a bad smelling vaginal discharge.   You have pain when you urinate.   You notice increased swelling in your face, hands, legs, or ankles.   You are exposed to fifth disease or chickenpox.   You are exposed to German measles (rubella) and have never had it.  Get help right away if:   You have a fever.   You are leaking fluid from your vagina.   You have spotting or bleeding from your vagina.   You have severe abdominal cramping or pain.   You have rapid weight gain or loss.   You vomit blood or material that looks like coffee grounds.   You develop a severe headache.   You have shortness of breath.   You have any kind of trauma, such as from a fall or a car accident.  Summary   The first trimester of pregnancy is from week 1 until   the end of week 13 (months 1 through 3).   Your body goes through many changes during pregnancy. The changes vary from  woman to woman.   You will have routine prenatal visits. During those visits, your health care provider will examine you, discuss any test results you may have, and talk with you about how you are feeling.  This information is not intended to replace advice given to you by your health care provider. Make sure you discuss any questions you have with your health care provider.  Document Released: 10/02/2001 Document Revised: 09/19/2016 Document Reviewed: 09/19/2016  Elsevier Interactive Patient Education  2019 [employer]

## 2018-11-05 ENCOUNTER — Encounter: Payer: BLUE CROSS/BLUE SHIELD | Admitting: Obstetrics and Gynecology

## 2018-11-12 ENCOUNTER — Ambulatory Visit (INDEPENDENT_AMBULATORY_CARE_PROVIDER_SITE_OTHER): Payer: BLUE CROSS/BLUE SHIELD

## 2018-11-12 ENCOUNTER — Other Ambulatory Visit: Payer: BLUE CROSS/BLUE SHIELD

## 2018-11-12 DIAGNOSIS — N8311 Corpus luteum cyst of right ovary: Secondary | ICD-10-CM | POA: Diagnosis not present

## 2018-11-12 DIAGNOSIS — N926 Irregular menstruation, unspecified: Secondary | ICD-10-CM

## 2018-11-12 DIAGNOSIS — Z3481 Encounter for supervision of other normal pregnancy, first trimester: Secondary | ICD-10-CM

## 2018-11-12 DIAGNOSIS — Z113 Encounter for screening for infections with a predominantly sexual mode of transmission: Secondary | ICD-10-CM

## 2018-11-12 DIAGNOSIS — Z3A01 Less than 8 weeks gestation of pregnancy: Secondary | ICD-10-CM

## 2018-11-12 DIAGNOSIS — Z0283 Encounter for blood-alcohol and blood-drug test: Secondary | ICD-10-CM

## 2018-11-12 DIAGNOSIS — O3481 Maternal care for other abnormalities of pelvic organs, first trimester: Secondary | ICD-10-CM | POA: Diagnosis not present

## 2018-11-12 NOTE — Progress Notes (Unsigned)
Here for viability and dating scan and reviewed the following findings:   Findings:  Singleton intrauterine pregnancy is visualized with only gestational sac (Gestational age according to MSD = [redacted]W[redacted]D) and yolk sac are seen without an embryo Yolk sac is visualized and appears normal and early anatomy is normal. Amnion: not visualized   Right Ovary is normal in appearance. Left Ovary is normal appearance. Corpus luteal cyst:  Right ovary Survey of the adnexa demonstrates no adnexal masses. There is no free peritoneal fluid in the cul de sac.  Impression: 1. NON  Viable Singleton Intrauterine pregnancy by U/S,  with only gestational sac (Gestational age according to MSD = [redacted]W[redacted]D) and yolk sac are seen without an embryo    Counseled on concerns for missed/impending miscarriage verses early gestational sac. Labs obtained and will follow up accordingly. Patient denies any vaginal bleeding to date. RTC 1 week to repeat scan.  Cabrini Ruggieri,CNM

## 2018-11-12 NOTE — Patient Instructions (Incomplete)
WE WOULD LOVE TO HEAR FROM YOU!!!!   Thank you Jacqulynn Cadet for visiting [facility].  Providing our patients with the best experience possible is really important to [place], and we hope that you felt that on your recent visit. The most valuable feedback we get comes from YOU!!    If you receive a survey please take a couple of minutes to let us know how we did.Thank you for continuing to trust [place] with your care.   [facility]  Commonly Asked Questions During Pregnancy  Cats: A parasite can be excreted in cat feces.  To avoid exposure you need to have another person empty the little box.  If you must empty the litter box you will need to wear gloves.  Wash your hands after handling your cat.  This parasite can also be found in raw or undercooked meat so this should also be avoided.  Colds, Sore Throats, Flu: Please check your medication sheet to see what you can take for symptoms.  If your symptoms are unrelieved by these medications please call the office.  Dental Work: Most any dental work Agricultural consultant recommends is permitted.  X-rays should only be taken during the first trimester if absolutely necessary.  Your abdomen should be shielded with a lead apron during all x-rays.  Please notify your provider prior to receiving any x-rays.  Novocaine is fine; gas is not recommended.  If your dentist requires a note from [place] prior to dental work please call the office and we will provide one for you.  Exercise: Exercise is an important part of staying healthy during your pregnancy.  You may continue most exercises you were accustomed to prior to pregnancy.  Later in your pregnancy you will most likely notice you have difficulty with activities requiring balance like riding a bicycle.  It is important that you listen to your body and avoid activities that put you at a higher risk of falling.  Adequate rest and staying well hydrated are a must!  If you have  questions about the safety of specific activities ask your provider.    Exposure to Children with illness: Try to avoid obvious exposure; report any symptoms to [place] when noted,  If you have chicken pos, red measles or mumps, you should be immune to these diseases.   Please do not take any vaccines while pregnant unless you have checked with your OB provider.  Fetal Movement: After 28 weeks we recommend you do "kick counts" twice daily.  Lie or sit down in a calm quiet environment and count your baby movements "kicks".  You should feel your baby at least 10 times per hour.  If you have not felt 10 kicks within the first hour get up, walk around and have something sweet to eat or drink then repeat for an additional hour.  If count remains less than 10 per hour notify your provider.  Fumigating: Follow your pest control agent's advice as to how long to stay out of your home.  Ventilate the area well before re-entering.  Hemorrhoids:   Most over-the-counter preparations can be used during pregnancy.  Check your medication to see what is safe to use.  It is important to use a stool softener or fiber in your diet and to drink lots of liquids.  If hemorrhoids seem to be getting worse please call the office.  Hot Tubs:  Hot tubs Jacuzzis and saunas are not recommended while pregnant.  These increase your internal body temperature and should be avoided.  Intercourse:  Sexual intercourse is safe during pregnancy as long as you are comfortable, unless otherwise advised by your provider.  Spotting may occur after intercourse; report any bright red bleeding that is heavier than spotting.  Labor:  If you know that you are in labor, please go to the hospital.  If you are unsure, please call the office and let us help you decide what to do.  Lifting, straining, etc:  If your job requires heavy lifting or straining please check with your provider for any limitations.  Generally, you should not lift items heavier than  that you can lift simply with your hands and arms (no back muscles)  Painting:  Paint fumes do not harm your pregnancy, but may make you ill and should be avoided if possible.  Latex or water based paints have less odor than oils.  Use adequate ventilation while painting.  Permanents & Hair Color:  Chemicals in hair dyes are not recommended as they cause increase hair dryness which can increase hair loss during pregnancy.  " Highlighting" and permanents are allowed.  Dye may be absorbed differently and permanents may not hold as well during pregnancy.  Sunbathing:  Use a sunscreen, as skin burns easily during pregnancy.  Drink plenty of fluids; avoid over heating.  Tanning Beds:  Because their possible side effects are still unknown, tanning beds are not recommended.  Ultrasound Scans:  Routine ultrasounds are performed at approximately 20 weeks.  You will be able to see your baby's general anatomy an if you would like to know the gender this can usually be determined as well.  If it is questionable when you conceived you may also receive an ultrasound early in your pregnancy for dating purposes.  Otherwise ultrasound exams are not routinely performed unless there is a medical necessity.  Although you can request a scan we ask that you pay for it when conducted because insurance does not cover " patient request" scans.  Work: If your pregnancy proceeds without complications you may work until your due date, unless your physician or employer advises otherwise.  Round Ligament Pain/Pelvic Discomfort:  Sharp, shooting pains not associated with bleeding are fairly common, usually occurring in the second trimester of pregnancy.  They tend to be worse when standing up or when you remain standing for long periods of time.  These are the result of pressure of certain pelvic ligaments called "round ligaments".  Rest, Tylenol and heat seem to be the most effective relief.  As the womb and fetus grow, they rise  out of the pelvis and the discomfort improves.  Please notify the office if your pain seems different than that described.  It may represent a more serious condition.      How a Baby Grows During Pregnancy  Pregnancy begins when a female's sperm enters a female's egg (fertilization). Fertilization usually happens in one of the tubes (fallopian tubes) that connect the ovaries to the womb (uterus). The fertilized egg moves down the fallopian tube to the uterus. Once it reaches the uterus, it implants into the lining of the uterus and begins to grow. For the first 10 weeks, the fertilized egg is called an embryo. After 10 weeks, it is called a fetus. As the fetus continues to grow, it receives oxygen and nutrients through tissue (placenta) that grows to support the developing  baby. The placenta is the life support system for the baby. It provides oxygen and nutrition and removes waste. Learning as much as you can about your pregnancy and how your baby is developing can help you enjoy the experience. It can also make you aware of when there might be a problem and when to ask questions. How long does a typical pregnancy last? A pregnancy usually lasts 280 days, or about 40 weeks. Pregnancy is divided into three periods of growth, also called trimesters:  First trimester: 0-12 weeks.  Second trimester: 13-27 weeks.  Third trimester: 28-40 weeks. The day when your baby is ready to be born (full term) is your estimated date of delivery. How does my baby develop month by month? First month  The fertilized egg attaches to the inside of the uterus.  Some cells will form the placenta. Others will form the fetus.  The arms, legs, brain, spinal cord, lungs, and heart begin to develop.  At the end of the first month, the heart begins to beat. Second month  The bones, inner ear, eyelids, hands, and feet form.  The genitals develop.  By the end of 8 weeks, all major organs are developing. Third  month  All of the internal organs are forming.  Teeth develop below the gums.  Bones and muscles begin to grow. The spine can flex.  The skin is transparent.  Fingernails and toenails begin to form.  Arms and legs continue to grow longer, and hands and feet develop.  The fetus is about 3 inches (7.6 cm) long. Fourth month  The placenta is completely formed.  The external sex organs, neck, outer ear, eyebrows, eyelids, and fingernails are formed.  The fetus can hear, swallow, and move its arms and legs.  The kidneys begin to produce urine.  The skin is covered with a white, waxy coating (vernix) and very fine hair (lanugo). Fifth month  The fetus moves around more and can be felt for the first time (quickening).  The fetus starts to sleep and wake up and may begin to suck its finger.  The nails grow to the end of the fingers.  The organ in the digestive system that makes bile (gallbladder) functions and helps to digest nutrients.  If your baby is a girl, eggs are present in her ovaries. If your baby is a boy, testicles start to move down into his scrotum. Sixth month  The lungs are formed.  The eyes open. The brain continues to develop.  Your baby has fingerprints and toe prints. Your baby's hair grows thicker.  At the end of the second trimester, the fetus is about 9 inches (22.9 cm) long. Seventh month  The fetus kicks and stretches.  The eyes are developed enough to sense changes in light.  The hands can make a grasping motion.  The fetus responds to sound. Eighth month  All organs and body systems are fully developed and functioning.  Bones harden, and taste buds develop. The fetus may hiccup.  Certain areas of the brain are still developing. The skull remains soft. Ninth month  The fetus gains about  lb (0.23 kg) each week.  The lungs are fully developed.  Patterns of sleep develop.  The fetus's head typically moves into a head-down position  (vertex) in the uterus to prepare for birth.  The fetus weighs 6-9 lb (2.72-4.08 kg) and is 19-20 inches (48.26-50.8 cm) long. What can I do to have a healthy pregnancy and help my baby  develop? General instructions  Take prenatal vitamins as directed by your health care provider. These include vitamins such as folic acid, iron, calcium, and vitamin D. They are important for healthy development.  Take medicines only as directed by your health care provider. Read labels and ask a pharmacist or your health care provider whether over-the-counter medicines, supplements, and prescription drugs are safe to take during pregnancy.  Keep all follow-up visits as directed by your health care provider. This is important. Follow-up visits include prenatal care and screening tests. How do I know if my baby is developing well? At each prenatal visit, your health care provider will do several different tests to check on your health and keep track of your baby's development. These include:  Fundal height and position. ? Your health care provider will measure your growing belly from your pubic bone to the top of the uterus using a tape measure. ? Your health care provider will also feel your belly to determine your baby's position.  Heartbeat. ? An ultrasound in the first trimester can confirm pregnancy and show a heartbeat, depending on how far along you are. ? Your health care provider will check your baby's heart rate at every prenatal visit.  Second trimester ultrasound. ? This ultrasound checks your baby's development. It also may show your baby's gender. What should I do if I have concerns about my baby's development? Always talk with your health care provider about any concerns that you may have about your pregnancy and your baby. Summary  A pregnancy usually lasts 280 days, or about 40 weeks. Pregnancy is divided into three periods of growth, also called trimesters.  Your health care provider  will monitor your baby's growth and development throughout your pregnancy.  Follow your health care provider's recommendations about taking prenatal vitamins and medicines during your pregnancy.  Talk with your health care provider if you have any concerns about your pregnancy or your developing baby. This information is not intended to replace advice given to you by your health care provider. Make sure you discuss any questions you have with your health care provider. Document Released: 03/26/2008 Document Revised: 08/21/2017 Document Reviewed: 08/21/2017 Elsevier Interactive Patient Education  2019 [employer]  First Trimester of Pregnancy The first trimester of pregnancy is from week 1 until the end of week 13 (months 1 through 3). A week after a sperm fertilizes an egg, the egg will implant on the wall of the uterus. This embryo will begin to develop into a baby. Genes from you and your partner will form the baby. The female genes will determine whether the baby will be a boy or a girl. At 6-8 weeks, the eyes and face will be formed, and the heartbeat can be seen on ultrasound. At the end of 12 weeks, all the baby's organs will be formed. Now that you are pregnant, you will want to do everything you can to have a healthy baby. Two of the most important things are to get good prenatal care and to follow your health care provider's instructions. Prenatal care is all the medical care you receive before the baby's birth. This care will help prevent, find, and treat any problems during the pregnancy and childbirth. Body changes during your first trimester Your body goes through many changes during pregnancy. The changes vary from woman to woman.  You may gain or lose a couple of pounds at first.  You may feel sick to your stomach (nauseous) and you may throw up (vomit).  If the vomiting is uncontrollable, call your health care provider.  You may tire easily.  You may develop headaches that can be  relieved by medicines. All medicines should be approved by your health care provider.  You may urinate more often. Painful urination may mean you have a bladder infection.  You may develop heartburn as a result of your pregnancy.  You may develop constipation because certain hormones are causing the muscles that push stool through your intestines to slow down.  You may develop hemorrhoids or swollen veins (varicose veins).  Your breasts may begin to grow larger and become tender. Your nipples may stick out more, and the tissue that surrounds them (areola) may become darker.  Your gums may bleed and may be sensitive to brushing and flossing.  Dark spots or blotches (chloasma, mask of pregnancy) may develop on your face. This will likely fade after the baby is born.  Your menstrual periods will stop.  You may have a loss of appetite.  You may develop cravings for certain kinds of food.  You may have changes in your emotions from day to day, such as being excited to be pregnant or being concerned that something may go wrong with the pregnancy and baby.  You may have more vivid and strange dreams.  You may have changes in your hair. These can include thickening of your hair, rapid growth, and changes in texture. Some women also have hair loss during or after pregnancy, or hair that feels dry or thin. Your hair will most likely return to normal after your baby is born. What to expect at prenatal visits During a routine prenatal visit:  You will be weighed to make sure you and the baby are growing normally.  Your blood pressure will be taken.  Your abdomen will be measured to track your baby's growth.  The fetal heartbeat will be listened to between weeks 10 and 14 of your pregnancy.  Test results from any previous visits will be discussed. Your health care provider may ask you:  How you are feeling.  If you are feeling the baby move.  If you have had any abnormal symptoms,  such as leaking fluid, bleeding, severe headaches, or abdominal cramping.  If you are using any tobacco products, including cigarettes, chewing tobacco, and electronic cigarettes.  If you have any questions. Other tests that may be performed during your first trimester include:  Blood tests to find your blood type and to check for the presence of any previous infections. The tests will also be used to check for low iron levels (anemia) and protein on red blood cells (Rh antibodies). Depending on your risk factors, or if you previously had diabetes during pregnancy, you may have tests to check for high blood sugar that affects pregnant women (gestational diabetes).  Urine tests to check for infections, diabetes, or protein in the urine.  An ultrasound to confirm the proper growth and development of the baby.  Fetal screens for spinal cord problems (spina bifida) and Down syndrome.  HIV (human immunodeficiency virus) testing. Routine prenatal testing includes screening for HIV, unless you choose not to have this test.  You may need other tests to make sure you and the baby are doing well. Follow these instructions at home: Medicines  Follow your health care provider's instructions regarding medicine use. Specific medicines may be either safe or unsafe to take during pregnancy.  Take a prenatal vitamin that contains at least 600 micrograms (mcg) of folic acid.  If you develop constipation, try taking a stool softener if your health care provider approves. Eating and drinking   Eat a balanced diet that includes fresh fruits and vegetables, whole grains, good sources of protein such as meat, eggs, or tofu, and low-fat dairy. Your health care provider will help you determine the amount of weight gain that is right for you.  Avoid raw meat and uncooked cheese. These carry germs that can cause birth defects in the baby.  Eating four or five small meals rather than three large meals a day may  help relieve nausea and vomiting. If you start to feel nauseous, eating a few soda crackers can be helpful. Drinking liquids between meals, instead of during meals, also seems to help ease nausea and vomiting.  Limit foods that are high in fat and processed sugars, such as fried and sweet foods.  To prevent constipation: ? Eat foods that are high in fiber, such as fresh fruits and vegetables, whole grains, and beans. ? Drink enough fluid to keep your urine clear or pale yellow. Activity  Exercise only as directed by your health care provider. Most women can continue their usual exercise routine during pregnancy. Try to exercise for 30 minutes at least 5 days a week. Exercising will help you: ? Control your weight. ? Stay in shape. ? Be prepared for labor and delivery.  Experiencing pain or cramping in the lower abdomen or lower back is a good sign that you should stop exercising. Check with your health care provider before continuing with normal exercises.  Try to avoid standing for long periods of time. Move your legs often if you must stand in one place for a long time.  Avoid heavy lifting.  Wear low-heeled shoes and practice good posture.  You may continue to have sex unless your health care provider tells you not to. Relieving pain and discomfort  Wear a good support bra to relieve breast tenderness.  Take warm sitz baths to soothe any pain or discomfort caused by hemorrhoids. Use hemorrhoid cream if your health care provider approves.  Rest with your legs elevated if you have leg cramps or low back pain.  If you develop varicose veins in your legs, wear support hose. Elevate your feet for 15 minutes, 3-4 times a day. Limit salt in your diet. Prenatal care  Schedule your prenatal visits by the twelfth week of pregnancy. They are usually scheduled monthly at first, then more often in the last 2 months before delivery.  Write down your questions. Take them to your prenatal  visits.  Keep all your prenatal visits as told by your health care provider. This is important. Safety  Wear your seat belt at all times when driving.  Make a list of emergency phone numbers, including numbers for family, friends, the hospital, and police and fire departments. General instructions  Ask your health care provider for a referral to a local prenatal education class. Begin classes no later than the beginning of month 6 of your pregnancy.  Ask for help if you have counseling or nutritional needs during pregnancy. Your health care provider can offer advice or refer you to specialists for help with various needs.  Do not use hot tubs, steam rooms, or saunas.  Do not douche or use tampons or scented sanitary pads.  Do not cross your legs for long periods of time.  Avoid cat litter boxes and soil used by cats. These carry germs that can cause birth defects in the  baby and possibly loss of the fetus by miscarriage or stillbirth.  Avoid all smoking, herbs, alcohol, and medicines not prescribed by your health care provider. Chemicals in these products affect the formation and growth of the baby.  Do not use any products that contain nicotine or tobacco, such as cigarettes and e-cigarettes. If you need help quitting, ask your health care provider. You may receive counseling support and other resources to help you quit.  Schedule a dentist appointment. At home, brush your teeth with a soft toothbrush and be gentle when you floss. Contact a health care provider if:  You have dizziness.  You have mild pelvic cramps, pelvic pressure, or nagging pain in the abdominal area.  You have persistent nausea, vomiting, or diarrhea.  You have a bad smelling vaginal discharge.  You have pain when you urinate.  You notice increased swelling in your face, hands, legs, or ankles.  You are exposed to fifth disease or chickenpox.  You are exposed to [place] measles (rubella) and have never  had it. Get help right away if:  You have a fever.  You are leaking fluid from your vagina.  You have spotting or bleeding from your vagina.  You have severe abdominal cramping or pain.  You have rapid weight gain or loss.  You vomit blood or material that looks like coffee grounds.  You develop a severe headache.  You have shortness of breath.  You have any kind of trauma, such as from a fall or a car accident. Summary  The first trimester of pregnancy is from week 1 until the end of week 13 (months 1 through 3).  Your body goes through many changes during pregnancy. The changes vary from woman to woman.  You will have routine prenatal visits. During those visits, your health care provider will examine you, discuss any test results you may have, and talk with you about how you are feeling. This information is not intended to replace advice given to you by your health care provider. Make sure you discuss any questions you have with your health care provider. Document Released: 10/02/2001 Document Revised: 09/19/2016 Document Reviewed: 09/19/2016 Elsevier Interactive Patient Education  2019 [employer]

## 2018-11-13 LAB — BETA HCG QUANT (REF LAB): hCG Quant: 10963 m[IU]/mL

## 2018-11-18 ENCOUNTER — Ambulatory Visit (INDEPENDENT_AMBULATORY_CARE_PROVIDER_SITE_OTHER): Payer: BLUE CROSS/BLUE SHIELD

## 2018-11-18 ENCOUNTER — Ambulatory Visit (INDEPENDENT_AMBULATORY_CARE_PROVIDER_SITE_OTHER): Payer: BLUE CROSS/BLUE SHIELD | Admitting: Obstetrics and Gynecology

## 2018-11-18 DIAGNOSIS — N926 Irregular menstruation, unspecified: Secondary | ICD-10-CM

## 2018-11-18 DIAGNOSIS — N8311 Corpus luteum cyst of right ovary: Secondary | ICD-10-CM

## 2018-11-18 DIAGNOSIS — O3481 Maternal care for other abnormalities of pelvic organs, first trimester: Secondary | ICD-10-CM | POA: Diagnosis not present

## 2018-11-18 DIAGNOSIS — Z3A01 Less than 8 weeks gestation of pregnancy: Secondary | ICD-10-CM

## 2018-11-18 NOTE — Progress Notes (Signed)
Reviewed ultrasound findings, with EGA [redacted]w[redacted]d and EDC 07/11/2019. Feeling a little nausea otherwise OK, will return in 2-3 weeks for nurse intake and labs and then 5 weeks for New OB PE.   Jahmad Petrich,CNM  Ultrasound findings below:      Indications:Viability Findings:  Singleton intrauterine pregnancy is visualized with a CRL consistent with 103w3d gestation, giving an (U/S) EDD of 07/11/19.   FHR: 120 BPM CRL measurement: 6.4 mm Yolk sac is visualized and appears normal and early anatomy is normal. Amnion: visualized and appears normal   Right Ovary is normal in appearance. Left Ovary is normal appearance. Corpus luteal cyst:  Right ovary Survey of the adnexa demonstrates no adnexal masses. There is a trace  free peritoneal fluid in the cul de sac.  Impression: 1. 102w3d Viable Singleton Intrauterine pregnancy by U/S. 2. (U/S) EDD is 07/11/19.

## 2018-11-28 ENCOUNTER — Other Ambulatory Visit: Payer: Self-pay | Admitting: *Deleted

## 2018-11-28 MED ORDER — DOXYLAMINE-PYRIDOXINE 10-10 MG PO TBEC: 1.0000 | DELAYED_RELEASE_TABLET | Freq: Two times a day (BID) | ORAL | 4 refills | Status: DC

## 2018-12-03 NOTE — Progress Notes (Signed)
      Stephanie May presents for NOB nurse intake visit. Pregnancy confirmation done at [facility],11/04/18, with M. Aura Camps.  G2.  P1001.  LMP 09/02/18.  EDD 07/11/19.  [place]. Pregnancy education material explained and given.  0 cats in the home.  NOB labs ordered. BMI less than 30. TSH/HbgA1c not ordered. Sickle cell not order due to race. HIV and drug screen explained and ordered. Genetic screening discussed. Genetic testing; Unsure. Pt to discuss genetic testing with provider. PNV encouraged. Pt to follow up with provider in 3 weeks for NOB physical.  [facility] Financial Policy explained and reviewed. Pt stated that she understood. FMLA form explained and signed.   BP 106/62   Pulse (!) 57   Ht 5\' 6"  (1.676 m)   Wt 143 lb 6.4 oz (65 kg)   LMP 09/02/2018   BMI 23.15 kg/m

## 2018-12-03 NOTE — Patient Instructions (Signed)
WHAT OB PATIENTS CAN EXPECT   Confirmation of pregnancy and ultrasound ordered if medically indicated-[redacted] weeks gestation  New OB (NOB) intake with nurse and New OB (NOB) labs- [redacted] weeks gestation  New OB (NOB) physical examination with provider- 11/[redacted] weeks gestation  Flu vaccine-[redacted] weeks gestation  Anatomy scan-[redacted] weeks gestation  Glucose tolerance test, blood work to test for anemia, T-dap vaccine-[redacted] weeks gestation  Vaginal swabs/cultures-STD/Group B strep-[redacted] weeks gestation  Appointments every 4 weeks until 28 weeks  Every 2 weeks from 28 weeks until 36 weeks  Weekly visits from 36 weeks until delivery  Morning Sickness  Morning sickness is when you feel sick to your stomach (nauseous) during pregnancy. You may feel sick to your stomach and throw up (vomit). You may feel sick in the morning, but you can feel this way at any time of day. Some women feel very sick to their stomach and cannot stop throwing up (hyperemesis gravidarum). Follow these instructions at home: Medicines  Take over-the-counter and prescription medicines only as told by your doctor. Do not take any medicines until you talk with your doctor about them first.  Taking multivitamins before getting pregnant can stop or lessen the harshness of morning sickness. Eating and drinking  Eat dry toast or crackers before getting out of bed.  Eat 5 or 6 small meals a day.  Eat dry and bland foods like rice and baked potatoes.  Do not eat greasy, fatty, or spicy foods.  Have someone cook for you if the smell of food causes you to feel sick or throw up.  If you feel sick to your stomach after taking prenatal vitamins, take them at night or with a snack.  Eat protein when you need a snack. Nuts, yogurt, and cheese are good choices.  Drink fluids throughout the day.  Try ginger ale made with real ginger, ginger tea made from fresh grated ginger, or ginger candies. General instructions  Do not use any products  that have nicotine or tobacco in them, such as cigarettes and e-cigarettes. If you need help quitting, ask your doctor.  Use an air purifier to keep the air in your house free of smells.  Get lots of fresh air.  Try to avoid smells that make you feel sick.  Try: ? Wearing a bracelet that is used for seasickness (acupressure wristband). ? Going to a doctor who puts thin needles into certain body points (acupuncture) to improve how you feel. Contact a doctor if:  You need medicine to feel better.  You feel dizzy or light-headed.  You are losing weight. Get help right away if:  You feel very sick to your stomach and cannot stop throwing up.  You pass out (faint).  You have very bad pain in your belly. Summary  Morning sickness is when you feel sick to your stomach (nauseous) during pregnancy.  You may feel sick in the morning, but you can feel this way at any time of day.  Making some changes to what you eat may help your symptoms go away. This information is not intended to replace advice given to you by your health care provider. Make sure you discuss any questions you have with your health care provider. Document Released: 11/15/2004 Document Revised: 11/08/2016 Document Reviewed: 11/08/2016 Elsevier Interactive Patient Education  2019 [employer]. How a Baby Grows During Pregnancy  Pregnancy begins when a female's sperm enters a female's egg (fertilization). Fertilization usually happens in one of the tubes (fallopian tubes) that connect  the ovaries to the womb (uterus). The fertilized egg moves down the fallopian tube to the uterus. Once it reaches the uterus, it implants into the lining of the uterus and begins to grow. For the first 10 weeks, the fertilized egg is called an embryo. After 10 weeks, it is called a fetus. As the fetus continues to grow, it receives oxygen and nutrients through tissue (placenta) that grows to support the developing baby. The placenta is the life  support system for the baby. It provides oxygen and nutrition and removes waste. Learning as much as you can about your pregnancy and how your baby is developing can help you enjoy the experience. It can also make you aware of when there might be a problem and when to ask questions. How long does a typical pregnancy last? A pregnancy usually lasts 280 days, or about 40 weeks. Pregnancy is divided into three periods of growth, also called trimesters:  First trimester: 0-12 weeks.  Second trimester: 13-27 weeks.  Third trimester: 28-40 weeks. The day when your baby is ready to be born (full term) is your estimated date of delivery. How does my baby develop month by month? First month  The fertilized egg attaches to the inside of the uterus.  Some cells will form the placenta. Others will form the fetus.  The arms, legs, brain, spinal cord, lungs, and heart begin to develop.  At the end of the first month, the heart begins to beat. Second month  The bones, inner ear, eyelids, hands, and feet form.  The genitals develop.  By the end of 8 weeks, all major organs are developing. Third month  All of the internal organs are forming.  Teeth develop below the gums.  Bones and muscles begin to grow. The spine can flex.  The skin is transparent.  Fingernails and toenails begin to form.  Arms and legs continue to grow longer, and hands and feet develop.  The fetus is about 3 inches (7.6 cm) long. Fourth month  The placenta is completely formed.  The external sex organs, neck, outer ear, eyebrows, eyelids, and fingernails are formed.  The fetus can hear, swallow, and move its arms and legs.  The kidneys begin to produce urine.  The skin is covered with a white, waxy coating (vernix) and very fine hair (lanugo). Fifth month  The fetus moves around more and can be felt for the first time (quickening).  The fetus starts to sleep and wake up and may begin to suck its  finger.  The nails grow to the end of the fingers.  The organ in the digestive system that makes bile (gallbladder) functions and helps to digest nutrients.  If your baby is a girl, eggs are present in her ovaries. If your baby is a boy, testicles start to move down into his scrotum. Sixth month  The lungs are formed.  The eyes open. The brain continues to develop.  Your baby has fingerprints and toe prints. Your baby's hair grows thicker.  At the end of the second trimester, the fetus is about 9 inches (22.9 cm) long. Seventh month  The fetus kicks and stretches.  The eyes are developed enough to sense changes in light.  The hands can make a grasping motion.  The fetus responds to sound. Eighth month  All organs and body systems are fully developed and functioning.  Bones harden, and taste buds develop. The fetus may hiccup.  Certain areas of the brain are still developing.  The skull remains soft. Ninth month  The fetus gains about  lb (0.23 kg) each week.  The lungs are fully developed.  Patterns of sleep develop.  The fetus's head typically moves into a head-down position (vertex) in the uterus to prepare for birth.  The fetus weighs 6-9 lb (2.72-4.08 kg) and is 19-20 inches (48.26-50.8 cm) long. What can I do to have a healthy pregnancy and help my baby develop? General instructions  Take prenatal vitamins as directed by your health care provider. These include vitamins such as folic acid, iron, calcium, and vitamin D. They are important for healthy development.  Take medicines only as directed by your health care provider. Read labels and ask a pharmacist or your health care provider whether over-the-counter medicines, supplements, and prescription drugs are safe to take during pregnancy.  Keep all follow-up visits as directed by your health care provider. This is important. Follow-up visits include prenatal care and screening tests. How do I know if my baby  is developing well? At each prenatal visit, your health care provider will do several different tests to check on your health and keep track of your baby's development. These include:  Fundal height and position. ? Your health care provider will measure your growing belly from your pubic bone to the top of the uterus using a tape measure. ? Your health care provider will also feel your belly to determine your baby's position.  Heartbeat. ? An ultrasound in the first trimester can confirm pregnancy and show a heartbeat, depending on how far along you are. ? Your health care provider will check your baby's heart rate at every prenatal visit.  Second trimester ultrasound. ? This ultrasound checks your baby's development. It also may show your baby's gender. What should I do if I have concerns about my baby's development? Always talk with your health care provider about any concerns that you may have about your pregnancy and your baby. Summary  A pregnancy usually lasts 280 days, or about 40 weeks. Pregnancy is divided into three periods of growth, also called trimesters.  Your health care provider will monitor your baby's growth and development throughout your pregnancy.  Follow your health care provider's recommendations about taking prenatal vitamins and medicines during your pregnancy.  Talk with your health care provider if you have any concerns about your pregnancy or your developing baby. This information is not intended to replace advice given to you by your health care provider. Make sure you discuss any questions you have with your health care provider. Document Released: 03/26/2008 Document Revised: 08/21/2017 Document Reviewed: 08/21/2017 Elsevier Interactive Patient Education  2019 [employer] Pregnancy  The first trimester of pregnancy is from week 1 until the end of week 13 (months 1 through 3). During this time, your baby will begin to develop inside  you. At 6-8 weeks, the eyes and face are formed, and the heartbeat can be seen on ultrasound. At the end of 12 weeks, all the baby's organs are formed. Prenatal care is all the medical care you receive before the birth of your baby. Make sure you get good prenatal care and follow all of your doctor's instructions. Follow these instructions at home: Medicines  Take over-the-counter and prescription medicines only as told by your doctor. Some medicines are safe and some medicines are not safe during pregnancy.  Take a prenatal vitamin that contains at least 600 micrograms (mcg) of folic acid.  If you have trouble pooping (constipation), take  medicine that will make your stool soft (stool softener) if your doctor approves. Eating and drinking   Eat regular, healthy meals.  Your doctor will tell you the amount of weight gain that is right for you.  Avoid raw meat and uncooked cheese.  If you feel sick to your stomach (nauseous) or throw up (vomit): ? Eat 4 or 5 small meals a day instead of 3 large meals. ? Try eating a few soda crackers. ? Drink liquids between meals instead of during meals.  To prevent constipation: ? Eat foods that are high in fiber, like fresh fruits and vegetables, whole grains, and beans. ? Drink enough fluids to keep your pee (urine) clear or pale yellow. Activity  Exercise only as told by your doctor. Stop exercising if you have cramps or pain in your lower belly (abdomen) or low back.  Do not exercise if it is too hot, too humid, or if you are in a place of great height (high altitude).  Try to avoid standing for long periods of time. Move your legs often if you must stand in one place for a long time.  Avoid heavy lifting.  Wear low-heeled shoes. Sit and stand up straight.  You can have sex unless your doctor tells you not to. Relieving pain and discomfort  Wear a good support bra if your breasts are sore.  Take warm water baths (sitz baths) to soothe  pain or discomfort caused by hemorrhoids. Use hemorrhoid cream if your doctor says it is okay.  Rest with your legs raised if you have leg cramps or low back pain.  If you have puffy, bulging veins (varicose veins) in your legs: ? Wear support hose or compression stockings as told by your doctor. ? Raise (elevate) your feet for 15 minutes, 3-4 times a day. ? Limit salt in your food. Prenatal care  Schedule your prenatal visits by the twelfth week of pregnancy.  Write down your questions. Take them to your prenatal visits.  Keep all your prenatal visits as told by your doctor. This is important. Safety  Wear your seat belt at all times when driving.  Make a list of emergency phone numbers. The list should include numbers for family, friends, the hospital, and police and fire departments. General instructions  Ask your doctor for a referral to a local prenatal class. Begin classes no later than at the start of month 6 of your pregnancy.  Ask for help if you need counseling or if you need help with nutrition. Your doctor can give you advice or tell you where to go for help.  Do not use hot tubs, steam rooms, or saunas.  Do not douche or use tampons or scented sanitary pads.  Do not cross your legs for long periods of time.  Avoid all herbs and alcohol. Avoid drugs that are not approved by your doctor.  Do not use any tobacco products, including cigarettes, chewing tobacco, and electronic cigarettes. If you need help quitting, ask your doctor. You may get counseling or other support to help you quit.  Avoid cat litter boxes and soil used by cats. These carry germs that can cause birth defects in the baby and can cause a loss of your baby (miscarriage) or stillbirth.  Visit your dentist. At home, brush your teeth with a soft toothbrush. Be gentle when you floss. Contact a doctor if:  You are dizzy.  You have mild cramps or pressure in your lower belly.  You have a  nagging pain  in your belly area.  You continue to feel sick to your stomach, you throw up, or you have watery poop (diarrhea).  You have a bad smelling fluid coming from your vagina.  You have pain when you pee (urinate).  You have increased puffiness (swelling) in your face, hands, legs, or ankles. Get help right away if:  You have a fever.  You are leaking fluid from your vagina.  You have spotting or bleeding from your vagina.  You have very bad belly cramping or pain.  You gain or lose weight rapidly.  You throw up blood. It may look like coffee grounds.  You are around people who have German measles, fifth disease, or chickenpox.  You have a very bad headache.  You have shortness of breath.  You have any kind of trauma, such as from a fall or a car accident. Summary  The first trimester of pregnancy is from week 1 until the end of week 13 (months 1 through 3).  To take care of yourself and your unborn baby, you will need to eat healthy meals, take medicines only if your doctor tells you to do so, and do activities that are safe for you and your baby.  Keep all follow-up visits as told by your doctor. This is important as your doctor will have to ensure that your baby is healthy and growing well. This information is not intended to replace advice given to you by your health care provider. Make sure you discuss any questions you have with your health care provider. Document Released: 03/26/2008 Document Revised: 10/16/2016 Document Reviewed: 10/16/2016 Elsevier Interactive Patient Education  2019 [employer] Commonly Asked Questions During Pregnancy  Cats: A parasite can be excreted in cat feces.  To avoid exposure you need to have another person empty the little box.  If you must empty the litter box you will need to wear gloves.  Wash your hands after handling your cat.  This parasite can also be found in raw or undercooked meat so this should also be avoided.  Colds, Sore  Throats, Flu: Please check your medication sheet to see what you can take for symptoms.  If your symptoms are unrelieved by these medications please call the office.  Dental Work: Most any dental work your dentist recommends is permitted.  X-rays should only be taken during the first trimester if absolutely necessary.  Your abdomen should be shielded with a lead apron during all x-rays.  Please notify your provider prior to receiving any x-rays.  Novocaine is fine; gas is not recommended.  If your dentist requires a note from us prior to dental work please call the office and we will provide one for you.  Exercise: Exercise is an important part of staying healthy during your pregnancy.  You may continue most exercises you were accustomed to prior to pregnancy.  Later in your pregnancy you will most likely notice you have difficulty with activities requiring balance like riding a bicycle.  It is important that you listen to your body and avoid activities that put you at a higher risk of falling.  Adequate rest and staying well hydrated are a must!  If you have questions about the safety of specific activities ask your provider.    Exposure to Children with illness: Try to avoid obvious exposure; report any symptoms to us when noted,  If you have chicken pos, red measles or mumps, you should be immune to these diseases.     Please do not take any vaccines while pregnant unless you have checked with your OB provider.  Fetal Movement: After 28 weeks we recommend you do "kick counts" twice daily.  Lie or sit down in a calm quiet environment and count your baby movements "kicks".  You should feel your baby at least 10 times per hour.  If you have not felt 10 kicks within the first hour get up, walk around and have something sweet to eat or drink then repeat for an additional hour.  If count remains less than 10 per hour notify your provider.  Fumigating: Follow your pest control agent's advice as to how long to  stay out of your home.  Ventilate the area well before re-entering.  Hemorrhoids:   Most over-the-counter preparations can be used during pregnancy.  Check your medication to see what is safe to use.  It is important to use a stool softener or fiber in your diet and to drink lots of liquids.  If hemorrhoids seem to be getting worse please call the office.   Hot Tubs:  Hot tubs Jacuzzis and saunas are not recommended while pregnant.  These increase your internal body temperature and should be avoided.  Intercourse:  Sexual intercourse is safe during pregnancy as long as you are comfortable, unless otherwise advised by your provider.  Spotting may occur after intercourse; report any bright red bleeding that is heavier than spotting.  Labor:  If you know that you are in labor, please go to the hospital.  If you are unsure, please call the office and let us help you decide what to do.  Lifting, straining, etc:  If your job requires heavy lifting or straining please check with your provider for any limitations.  Generally, you should not lift items heavier than that you can lift simply with your hands and arms (no back muscles)  Painting:  Paint fumes do not harm your pregnancy, but may make you ill and should be avoided if possible.  Latex or water based paints have less odor than oils.  Use adequate ventilation while painting.  Permanents & Hair Color:  Chemicals in hair dyes are not recommended as they cause increase hair dryness which can increase hair loss during pregnancy.  " Highlighting" and permanents are allowed.  Dye may be absorbed differently and permanents may not hold as well during pregnancy.  Sunbathing:  Use a sunscreen, as skin burns easily during pregnancy.  Drink plenty of fluids; avoid over heating.  Tanning Beds:  Because their possible side effects are still unknown, tanning beds are not recommended.  Ultrasound Scans:  Routine ultrasounds are performed at approximately 20  weeks.  You will be able to see your baby's general anatomy an if you would like to know the gender this can usually be determined as well.  If it is questionable when you conceived you may also receive an ultrasound early in your pregnancy for dating purposes.  Otherwise ultrasound exams are not routinely performed unless there is a medical necessity.  Although you can request a scan we ask that you pay for it when conducted because insurance does not cover " patient request" scans.  Work: If your pregnancy proceeds without complications you may work until your due date, unless your physician or employer advises otherwise.  Round Ligament Pain/Pelvic Discomfort:  Sharp, shooting pains not associated with bleeding are fairly common, usually occurring in the second trimester of pregnancy.  They tend to be worse when standing up or  when you remain standing for long periods of time.  These are the result of pressure of certain pelvic ligaments called "round ligaments".  Rest, Tylenol and heat seem to be the most effective relief.  As the womb and fetus grow, they rise out of the pelvis and the discomfort improves.  Please notify the office if your pain seems different than that described.  It may represent a more serious condition.  Common Medications Safe in Pregnancy  Acne:      Constipation:  Benzoyl Peroxide     Colace  Clindamycin      Dulcolax Suppository  Topica Erythromycin     Fibercon  Salicylic Acid      Metamucil         Miralax AVOID:        Senakot   Accutane    Cough:  Retin-A       Cough Drops  Tetracycline      Phenergan w/ Codeine if Rx  Minocycline      Robitussin (Plain & DM)  Antibiotics:     Crabs/Lice:  Ceclor       RID  Cephalosporins    AVOID:  E-Mycins      Kwell  Keflex  Macrobid/Macrodantin   Diarrhea:  Penicillin      Kao-Pectate  Zithromax      Imodium AD         PUSH FLUIDS AVOID:       Cipro     Fever:  Tetracycline      Tylenol (Regular or  Extra  Minocycline       Strength)  Levaquin      Extra Strength-Do not          Exceed 8 tabs/24 hrs Caffeine:        <224m/day (equiv. To 1 cup of coffee or  approx. 3 12 oz sodas)         Gas: Cold/Hayfever:       Gas-X  Benadryl      Mylicon  Claritin       Phazyme  **Claritin-D        Chlor-Trimeton    Headaches:  Dimetapp      ASA-Free Excedrin  Drixoral-Non-Drowsy     Cold Compress  Mucinex (Guaifenasin)     Tylenol (Regular or Extra  Sudafed/Sudafed-12 Hour     Strength)  **Sudafed PE Pseudoephedrine   Tylenol Cold & Sinus     Vicks Vapor Rub  Zyrtec  **AVOID if Problems With Blood Pressure         Heartburn: Avoid lying down for at least 1 hour after meals  Aciphex      Maalox     Rash:  Milk of Magnesia     Benadryl    Mylanta       1% Hydrocortisone Cream  Pepcid  Pepcid Complete   Sleep Aids:  Prevacid      Ambien   Prilosec       Benadryl  Rolaids       Chamomile Tea  Tums (Limit 4/day)     Unisom  Zantac       Tylenol PM         Warm milk-add vanilla or  Hemorrhoids:       Sugar for taste  Anusol/Anusol H.C.  (RX: Analapram 2.5%)  Sugar Substitutes:  Hydrocortisone OTC     Ok in moderation  Preparation H      Tucks  Vaseline lotion applied to tissue with wiping    Herpes:     Throat:  Acyclovir      Oragel  Famvir  Valtrex     Vaccines:         Flu Shot Leg Cramps:       *Gardasil  Benadryl      Hepatitis A         Hepatitis B Nasal Spray:       Pneumovax  Saline Nasal Spray     Polio Booster         Tetanus Nausea:       Tuberculosis test or PPD  Vitamin B6 25 mg TID   AVOID:    Dramamine      *Gardasil  Emetrol       Live Poliovirus  Ginger Root 250 mg QID    MMR (measles, mumps &  High Complex Carbs @ Bedtime    rebella)  Sea Bands-Accupressure    Varicella (Chickenpox)  Unisom 1/2 tab TID     *No known complications           If received before Pain:         Known pregnancy;   Darvocet       Resume series  after  Lortab        Delivery  Percocet    Yeast:   Tramadol      Femstat  Tylenol 3      Gyne-lotrimin  Ultram       Monistat  Vicodin           MISC:         All Sunscreens           Hair Coloring/highlights          Insect Repellant's          (Including DEET)         Mystic Tans

## 2018-12-04 ENCOUNTER — Ambulatory Visit (INDEPENDENT_AMBULATORY_CARE_PROVIDER_SITE_OTHER): Payer: BLUE CROSS/BLUE SHIELD | Admitting: Obstetrics and Gynecology

## 2018-12-04 VITALS — BP 106/62 | HR 57 | Ht 66.0 in | Wt 143.4 lb

## 2018-12-04 DIAGNOSIS — Z3481 Encounter for supervision of other normal pregnancy, first trimester: Secondary | ICD-10-CM

## 2018-12-04 DIAGNOSIS — Z113 Encounter for screening for infections with a predominantly sexual mode of transmission: Secondary | ICD-10-CM

## 2018-12-04 DIAGNOSIS — Z0283 Encounter for blood-alcohol and blood-drug test: Secondary | ICD-10-CM

## 2018-12-05 LAB — RPR: RPR Ser Ql: NONREACTIVE

## 2018-12-05 LAB — URINALYSIS, ROUTINE W REFLEX MICROSCOPIC
Bilirubin, UA: NEGATIVE
Glucose, UA: NEGATIVE
Ketones, UA: NEGATIVE
Leukocytes, UA: NEGATIVE
Nitrite, UA: NEGATIVE
Protein, UA: NEGATIVE
RBC, UA: NEGATIVE
Specific Gravity, UA: 1.019 (ref 1.005–1.030)
Urobilinogen, Ur: 0.2 mg/dL (ref 0.2–1.0)
pH, UA: 6.5 (ref 5.0–7.5)

## 2018-12-05 LAB — DRUG PROFILE, UR, 9 DRUGS (LABCORP)
Amphetamines, Urine: NEGATIVE ng/mL
Barbiturate Quant, Ur: NEGATIVE ng/mL
Benzodiazepine Quant, Ur: NEGATIVE ng/mL
Cannabinoid Quant, Ur: NEGATIVE ng/mL
Cocaine (Metab.): NEGATIVE ng/mL
Methadone Screen, Urine: NEGATIVE ng/mL
Opiate Quant, Ur: NEGATIVE ng/mL
PCP Quant, Ur: NEGATIVE ng/mL
Propoxyphene: NEGATIVE ng/mL

## 2018-12-05 LAB — RUBELLA SCREEN: Rubella Antibodies, IGG: 2 index (ref >0.99)

## 2018-12-05 LAB — NICOTINE SCREEN, URINE: Cotinine Ql Scrn, Ur: NEGATIVE ng/mL

## 2018-12-05 LAB — VARICELLA ZOSTER ANTIBODY, IGG: Varicella zoster IgG: <135 index — ABNORMAL LOW (ref >165)

## 2018-12-05 LAB — HIV ANTIBODY (ROUTINE TESTING W REFLEX): HIV Screen 4th Generation wRfx: NONREACTIVE

## 2018-12-05 LAB — ABO AND RH: Rh Factor: POSITIVE

## 2018-12-05 LAB — HEPATITIS B SURFACE ANTIGEN: Hepatitis B Surface Ag: NEGATIVE

## 2018-12-05 LAB — ANTIBODY SCREEN: Antibody Screen: NEGATIVE

## 2018-12-06 LAB — URINE CULTURE, OB REFLEX

## 2018-12-06 LAB — CULTURE, OB URINE

## 2018-12-07 LAB — GC/CHLAMYDIA PROBE AMP
Chlamydia trachomatis, NAA: NEGATIVE
Neisseria gonorrhoeae by PCR: NEGATIVE

## 2018-12-18 ENCOUNTER — Other Ambulatory Visit: Payer: BLUE CROSS/BLUE SHIELD

## 2018-12-18 DIAGNOSIS — Z3401 Encounter for supervision of normal first pregnancy, first trimester: Secondary | ICD-10-CM

## 2018-12-18 NOTE — Progress Notes (Unsigned)
,  panaroma 12/18/18

## 2018-12-19 LAB — CBC WITH DIFFERENTIAL/PLATELET
Basophils Absolute: 0 10*3/uL (ref 0.0–0.2)
Basos: 0 %
EOS (ABSOLUTE): 0 10*3/uL (ref 0.0–0.4)
Eos: 1 %
Hematocrit: 38.5 % (ref 34.0–46.6)
Hemoglobin: 13.3 g/dL (ref 11.1–15.9)
Immature Grans (Abs): 0 10*3/uL (ref 0.0–0.1)
Immature Granulocytes: 0 %
Lymphocytes Absolute: 1.5 10*3/uL (ref 0.7–3.1)
Lymphs: 24 %
MCH: 30.4 pg (ref 26.6–33.0)
MCHC: 34.5 g/dL (ref 31.5–35.7)
MCV: 88 fL (ref 79–97)
Monocytes Absolute: 0.2 10*3/uL (ref 0.1–0.9)
Monocytes: 3 %
Neutrophils Absolute: 4.6 10*3/uL (ref 1.4–7.0)
Neutrophils: 72 %
Platelets: 182 10*3/uL (ref 150–450)
RBC: 4.37 x10E6/uL (ref 3.77–5.28)
RDW: 12.1 % (ref 11.7–15.4)
WBC: 6.4 10*3/uL (ref 3.4–10.8)

## 2018-12-23 ENCOUNTER — Ambulatory Visit (INDEPENDENT_AMBULATORY_CARE_PROVIDER_SITE_OTHER): Payer: BLUE CROSS/BLUE SHIELD | Admitting: Obstetrics and Gynecology

## 2018-12-23 ENCOUNTER — Encounter: Payer: Self-pay | Admitting: Obstetrics and Gynecology

## 2018-12-23 VITALS — BP 122/65 | HR 58 | Wt 145.6 lb

## 2018-12-23 DIAGNOSIS — Z3491 Encounter for supervision of normal pregnancy, unspecified, first trimester: Secondary | ICD-10-CM

## 2018-12-23 DIAGNOSIS — Z3A11 11 weeks gestation of pregnancy: Secondary | ICD-10-CM

## 2018-12-23 DIAGNOSIS — Z3481 Encounter for supervision of other normal pregnancy, first trimester: Secondary | ICD-10-CM

## 2018-12-23 LAB — POCT URINALYSIS DIPSTICK OB
Bilirubin, UA: NEGATIVE
Blood, UA: NEGATIVE
Glucose, UA: NEGATIVE
Ketones, UA: NEGATIVE
Leukocytes, UA: NEGATIVE
Nitrite, UA: NEGATIVE
POC,PROTEIN,UA: NEGATIVE
Spec Grav, UA: 1.01 (ref 1.010–1.025)
Urobilinogen, UA: 0.2 E.U./dL
pH, UA: 7 (ref 5.0–8.0)

## 2018-12-23 NOTE — Progress Notes (Signed)
NEW OB HISTORY AND PHYSICAL  SUBJECTIVE:       Stephanie May is a 23 y.o. [id number] female, Patient's last menstrual period was 09/02/2018., Estimated Date of Delivery: 07/11/19, [redacted]w[redacted]d, presents today for establishment of Prenatal Care. She has no unusual complaints and complains of mild nausea      Gynecologic History Patient's last menstrual period was 09/02/2018. Normal Contraception: none Last Pap: 10/2018. Results were: normal  Obstetric History OB History  Gravida Para Term Preterm AB Living  2 1 1     1   SAB TAB Ectopic Multiple Live Births        0 1    # Outcome Date GA Lbr Len/2nd Weight Sex Delivery Anes PTL Lv  2 Current           1 Term 10/31/17 [redacted]w[redacted]d 02:50 / 00:26 6 lb 10.9 oz (3.03 kg) F Vag-Spont EPI  LIV    Past Medical History:  Diagnosis Date  . Chlamydia   . History of chlamydia infection 2 9 16   . Irregular menses   . Medical history non-contributory   . PCOS (polycystic ovarian syndrome)   . Pelvic pain in female   . Primary dysmenorrhea   . Sexually transmitted disease (STD)    previous h/o  . Unexplained weight gain     Past Surgical History:  Procedure Laterality Date  . NO PAST SURGERIES      Current Outpatient Medications on File Prior to Visit  Medication Sig Dispense Refill  . Prenatal Vit-Fe Fumarate-FA (PRENATAL MULTIVITAMIN) TABS tablet Take 1 tablet by mouth daily at 12 noon.    . Doxylamine-Pyridoxine 10-10 MG TBEC Take 1 tablet by mouth 2 (two) times daily. (Patient not taking: Reported on 12/23/2018) 60 tablet 4   No current facility-administered medications on file prior to visit.     Allergies  Allergen Reactions  . Gluten Meal   . Lactose Intolerance (Gi)   . Macrobid [Nitrofurantoin Macrocrystal]   . Sulfa Antibiotics     Social History   Socioeconomic History  . Marital status: Legally Separated    Spouse name: Not on file  . Number of children: 1  . Years of education: 4  . Highest education level: High  school graduate  Occupational History  . Not on file  Social Needs  . Financial resource strain: Not very hard  . Food insecurity:    Worry: Never true    Inability: Never true  . Transportation needs:    Medical: No    Non-medical: No  Tobacco Use  . Smoking status: Never Smoker  . Smokeless tobacco: Never Used  Substance and Sexual Activity  . Alcohol use: No  . Drug use: No  . Sexual activity: Yes    Partners: Male    Birth control/protection: None  Lifestyle  . Physical activity:    Days per week: 6 days    Minutes per session: 60 min  . Stress: To some extent  Relationships  . Social connections:    Talks on phone: More than three times a week    Gets together: More than three times a week    Attends religious service: More than 4 times per year    Active member of club or organization: Not on file    Attends meetings of clubs or organizations: Not on file    Relationship status: Separated  . Intimate partner violence:    Fear of current or ex partner: No    Emotionally  abused: No    Physically abused: No    Forced sexual activity: No  Other Topics Concern  . Not on file  Social History Narrative   ** Merged History Encounter **        Family History  Problem Relation Age of Onset  . Diabetes Maternal Grandfather   . Lung cancer Maternal Uncle     The following portions of the patient's history were reviewed and updated as appropriate: allergies, current medications, past OB history, past medical history, past surgical history, past family history, past social history, and problem list.    OBJECTIVE: Initial Physical Exam (New OB)  GENERAL APPEARANCE: alert, well appearing, in no apparent distress, oriented to person, place and time HEAD: normocephalic, atraumatic MOUTH: mucous membranes moist, pharynx normal without lesions and dental hygiene good THYROID: no thyromegaly or masses present BREASTS: no masses noted, no significant tenderness, no  palpable axillary nodes, no skin changes, not examined LUNGS: clear to auscultation, no wheezes, rales or rhonchi, symmetric air entry HEART: regular rate and rhythm, no murmurs ABDOMEN: soft, nontender, nondistended, no abnormal masses, no epigastric pain, fundus not palpable and FHT present EXTREMITIES: no redness or tenderness in the calves or thighs SKIN: normal coloration and turgor, no rashes LYMPH NODES: no adenopathy palpable NEUROLOGIC: alert, oriented, normal speech, no focal findings or movement disorder noted  PELVIC EXAM not indicated  ASSESSMENT: Normal pregnancy Varicella NI  PLAN: Prenatal care Will give varicella booster after pregnancy Note given to delay cruise in April due to pregnancy and cut coronovirus possible exposure concerns. See orders

## 2018-12-23 NOTE — Progress Notes (Signed)
NOB PE-pt is doing well 

## 2018-12-23 NOTE — Patient Instructions (Signed)

## 2019-01-14 ENCOUNTER — Other Ambulatory Visit: Payer: Self-pay | Admitting: *Deleted

## 2019-01-14 MED ORDER — CONCEPT DHA 53.5-38-1 MG PO CAPS: 1.0000 | ORAL_CAPSULE | Freq: Every day | ORAL | 4 refills | Status: DC

## 2019-01-20 ENCOUNTER — Encounter: Payer: BLUE CROSS/BLUE SHIELD | Admitting: Certified Nurse Midwife

## 2019-02-09 ENCOUNTER — Other Ambulatory Visit: Payer: Self-pay | Admitting: Obstetrics and Gynecology

## 2019-02-09 ENCOUNTER — Other Ambulatory Visit: Payer: Self-pay | Admitting: Internal Medicine

## 2019-02-09 DIAGNOSIS — Z3492 Encounter for supervision of normal pregnancy, unspecified, second trimester: Secondary | ICD-10-CM

## 2019-02-10 ENCOUNTER — Other Ambulatory Visit (INDEPENDENT_AMBULATORY_CARE_PROVIDER_SITE_OTHER): Payer: BLUE CROSS/BLUE SHIELD

## 2019-02-10 ENCOUNTER — Other Ambulatory Visit: Payer: Self-pay

## 2019-02-10 ENCOUNTER — Ambulatory Visit (INDEPENDENT_AMBULATORY_CARE_PROVIDER_SITE_OTHER): Payer: BLUE CROSS/BLUE SHIELD | Admitting: Certified Nurse Midwife

## 2019-02-10 VITALS — BP 109/60 | HR 67 | Wt 149.6 lb

## 2019-02-10 DIAGNOSIS — Z3492 Encounter for supervision of normal pregnancy, unspecified, second trimester: Secondary | ICD-10-CM

## 2019-02-10 DIAGNOSIS — Z3A18 18 weeks gestation of pregnancy: Secondary | ICD-10-CM

## 2019-02-10 LAB — POCT URINALYSIS DIPSTICK OB
Bilirubin, UA: NEGATIVE
Blood, UA: NEGATIVE
Glucose, UA: NEGATIVE
Ketones, UA: NEGATIVE
Nitrite, UA: NEGATIVE
POC,PROTEIN,UA: NEGATIVE
Spec Grav, UA: 1.015 (ref 1.010–1.025)
Urobilinogen, UA: 0.2 E.U./dL
pH, UA: 8 (ref 5.0–8.0)

## 2019-02-10 NOTE — Patient Instructions (Signed)
Abdominal Pain During Pregnancy  Belly (abdominal) pain is common during pregnancy. There are many possible causes. Most of the time, it is not a serious problem. Other times, it can be a sign that something is wrong with the pregnancy. Always tell your doctor if you have belly pain. Follow these instructions at home:  Do not have sex or put anything in your vagina until your pain goes away completely.  Get plenty of rest until your pain gets better.  Drink enough fluid to keep your pee (urine) pale yellow.  Take over-the-counter and prescription medicines only as told by your doctor.  Keep all follow-up visits as told by your doctor. This is important. Contact a doctor if:  Your pain continues or gets worse after resting.  You have lower belly pain that: ? Comes and goes at regular times. ? Spreads to your back. ? Feels like menstrual cramps.  You have pain or burning when you pee (urinate). Get help right away if:  You have a fever or chills.  You have vaginal bleeding.  You are leaking fluid from your vagina.  You are passing tissue from your vagina.  You throw up (vomit) for more than 24 hours.  You have watery poop (diarrhea) for more than 24 hours.  Your baby is moving less than usual.  You feel very weak or faint.  You have shortness of breath.  You have very bad pain in your upper belly. Summary  Belly (abdominal) pain is common during pregnancy. There are many possible causes.  If you have belly pain during pregnancy, tell your doctor right away.  Keep all follow-up visits as told by your doctor. This is important. This information is not intended to replace advice given to you by your health care provider. Make sure you discuss any questions you have with your health care provider. Document Released: 09/26/2009 Document Revised: 01/10/2017 Document Reviewed: 01/10/2017 Elsevier Interactive Patient Education  2019 [employer] Back Pain in Pregnancy  Back pain during pregnancy is common. Back pain may be caused by several factors that are related to changes during your pregnancy. Follow these instructions at home: Managing pain, stiffness, and swelling      If directed, for sudden (acute) back pain, put ice on the painful area. ? Put ice in a plastic bag. ? Place a towel between your skin and the bag. ? Leave the ice on for 20 minutes, 2-3 times per day.  If directed, apply heat to the affected area before you exercise. Use the heat source that your health care provider recommends, such as a moist heat pack or a heating pad. ? Place a towel between your skin and the heat source. ? Leave the heat on for 20-30 minutes. ? Remove the heat if your skin turns bright red. This is especially important if you are unable to feel pain, heat, or cold. You may have a greater risk of getting burned.  If directed, massage the affected area. Activity  Exercise as told by your health care provider. Gentle exercise is the best way to prevent or manage back pain.  Listen to your body when lifting. If lifting hurts, ask for help or bend your knees. This uses your leg muscles instead of your back muscles.  Squat down when picking up something from the floor. Do not bend over.  Only use bed rest for short periods as told by your health care provider. Bed rest should only be used for the most severe episodes  of back pain. Standing, sitting, and lying down  Do not stand in one place for long periods of time.  Use good posture when sitting. Make sure your head rests over your shoulders and is not hanging forward. Use a pillow on your lower back if necessary.  Try sleeping on your side, preferably the left side, with a pregnancy support pillow or 1-2 regular pillows between your legs. ? If you have back pain after a night's rest, your bed may be too soft. ? A firm mattress may provide more support for your back during pregnancy. General instructions   Do not wear high heels.  Eat a healthy diet. Try to gain weight within your health care provider's recommendations.  Use a maternity girdle, elastic sling, or back brace as told by your health care provider.  Take over-the-counter and prescription medicines only as told by your health care provider.  Work with a physical therapist or massage therapist to find ways to manage back pain. Acupuncture or massage therapy may be helpful.  Keep all follow-up visits as told by your health care provider. This is important. Contact a health care provider if:  Your back pain interferes with your daily activities.  You have increasing pain in other parts of your body. Get help right away if:  You develop numbness, tingling, weakness, or problems with the use of your arms or legs.  You develop severe back pain that is not controlled with medicine.  You have a change in bowel or bladder control.  You develop shortness of breath, dizziness, or you faint.  You develop nausea, vomiting, or sweating.  You have back pain that is a rhythmic, cramping pain similar to labor pains. Labor pain is usually 1-2 minutes apart, lasts for about 1 minute, and involves a bearing down feeling or pressure in your pelvis.  You have back pain and your water breaks or you have vaginal bleeding.  You have back pain or numbness that travels down your leg.  Your back pain developed after you fell.  You develop pain on one side of your back.  You see blood in your urine.  You develop skin blisters in the area of your back pain. Summary  Back pain may be caused by several factors that are related to changes during your pregnancy.  Follow instructions as told by your health care provider for managing pain, stiffness, and swelling.  Exercise as told by your health care provider. Gentle exercise is the best way to prevent or manage back pain.  Take over-the-counter and prescription medicines only as told by  your health care provider.  Keep all follow-up visits as told by your health care provider. This is important. This information is not intended to replace advice given to you by your health care provider. Make sure you discuss any questions you have with your health care provider. Document Released: 01/16/2006 Document Revised: 03/26/2018 Document Reviewed: 03/26/2018 Elsevier Interactive Patient Education  2019 [employer].

## 2019-02-10 NOTE — Progress Notes (Signed)
ROB- anatomy scan done today, pt is doing well 

## 2019-02-10 NOTE — Progress Notes (Signed)
ROB-Doing well, no questions or concerns. Anatomy scan complete and normal; findings reviewed with pt, verbalized understanding. Discussed COVID restrictions and precautions. Anticipatory guidance regarding course of prenatal care. Reviewed red flag symptoms and when to call. RTC x 6 weeks for labs and ROB or sooner if needed.    ULTRASOUND REPORT  Location: [facility] Date of Service: 02/10/2019   Indications:Anatomy Ultrasound Findings:  Singleton intrauterine pregnancy is visualized with FHR at 140 BPM. Biometrics give an (U/S) Gestational age of [redacted]w[redacted]d and an (U/S) EDD of 07/08/2019; this correlates with the clinically established Estimated Date of Delivery: 07/11/19  Fetal presentation is Cephalic.  EFW: 260g (9 oz). Placenta: posterior. Grade: 1 AFI: subjectively normal.  Anatomic survey is complete and normal; Gender - female.    Right Ovary is normal in appearance. Left Ovary is normal appearance. Survey of the adnexa demonstrates no adnexal masses. There is no free peritoneal fluid in the cul de sac.  Impression: 1. [redacted]w[redacted]d Viable Singleton Intrauterine pregnancy by U/S. 2. (U/S) EDD is consistent with Clinically established Estimated Date of Delivery: 07/11/19 . 3. Normal Anatomy Scan  Recommendations: 1.Clinical correlation with the patient's History and Physical Exam.

## 2019-02-24 ENCOUNTER — Encounter: Payer: BLUE CROSS/BLUE SHIELD | Admitting: Obstetrics and Gynecology

## 2019-03-20 ENCOUNTER — Other Ambulatory Visit: Payer: Self-pay

## 2019-03-20 ENCOUNTER — Emergency Department
Admission: EM | Admit: 2019-03-20 | Discharge: 2019-03-20 | Disposition: A | Discharge status: Home / Self Care | Payer: BLUE CROSS/BLUE SHIELD | Attending: Emergency Medicine | Admitting: Emergency Medicine

## 2019-03-20 DIAGNOSIS — Z3A24 24 weeks gestation of pregnancy: Secondary | ICD-10-CM | POA: Diagnosis not present

## 2019-03-20 DIAGNOSIS — R110 Nausea: Secondary | ICD-10-CM | POA: Diagnosis not present

## 2019-03-20 DIAGNOSIS — O9A212 Injury, poisoning and certain other consequences of external causes complicating pregnancy, second trimester: Secondary | ICD-10-CM | POA: Insufficient documentation

## 2019-03-20 DIAGNOSIS — Z041 Encounter for examination and observation following transport accident: Secondary | ICD-10-CM | POA: Insufficient documentation

## 2019-03-20 LAB — CBC
HCT: 34 % — ABNORMAL LOW (ref 36.0–46.0)
Hemoglobin: 11.8 g/dL — ABNORMAL LOW (ref 12.0–15.0)
MCH: 31.5 pg (ref 26.0–34.0)
MCHC: 34.7 g/dL (ref 30.0–36.0)
MCV: 90.7 fL (ref 80.0–100.0)
Platelets: 158 10*3/uL (ref 150–400)
RBC: 3.75 MIL/uL — ABNORMAL LOW (ref 3.87–5.11)
RDW: 12.8 % (ref 11.5–15.5)
WBC: 5.5 10*3/uL (ref 4.0–10.5)
nRBC: 0 % (ref 0.0–0.2)

## 2019-03-20 LAB — COMPREHENSIVE METABOLIC PANEL
ALT: 12 U/L (ref 0–44)
AST: 18 U/L (ref 15–41)
Albumin: 3.6 g/dL (ref 3.5–5.0)
Alkaline Phosphatase: 42 U/L (ref 38–126)
Anion gap: 7 (ref 5–15)
BUN: 8 mg/dL (ref 6–20)
CO2: 24 mmol/L (ref 22–32)
Calcium: 8.9 mg/dL (ref 8.9–10.3)
Chloride: 106 mmol/L (ref 98–111)
Creatinine, Ser: 0.59 mg/dL (ref 0.44–1.00)
GFR calc Af Amer: >60 mL/min (ref >60)
GFR calc non Af Amer: >60 mL/min (ref >60)
Glucose, Bld: 119 mg/dL — ABNORMAL HIGH (ref 70–99)
Potassium: 3.5 mmol/L (ref 3.5–5.1)
Sodium: 137 mmol/L (ref 135–145)
Total Bilirubin: 0.6 mg/dL (ref 0.3–1.2)
Total Protein: 6.8 g/dL (ref 6.5–8.1)

## 2019-03-20 LAB — URINALYSIS, COMPLETE (UACMP) WITH MICROSCOPIC
Bilirubin Urine: NEGATIVE
Glucose, UA: NEGATIVE mg/dL
Hgb urine dipstick: NEGATIVE
Ketones, ur: NEGATIVE mg/dL
Leukocytes,Ua: NEGATIVE
Nitrite: NEGATIVE
Protein, ur: NEGATIVE mg/dL
Specific Gravity, Urine: 1.005 (ref 1.005–1.030)
pH: 6 (ref 5.0–8.0)

## 2019-03-20 LAB — LIPASE, BLOOD: Lipase: 26 U/L (ref 11–51)

## 2019-03-20 MED ORDER — SODIUM CHLORIDE 0.9% FLUSH: 3.0000 mL | Freq: Once | INTRAVENOUS | Status: DC
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
## 2019-03-20 NOTE — ED Notes (Signed)
MD at bedside to assess patient baby with [place]. No further orders for fetal heart tones as baby assessed with Ultrasound.

## 2019-03-20 NOTE — ED Notes (Signed)
Pt alert and oriented X4, active, cooperative, pt in NAD. RR even and unlabored, color WNL.  Pt informed to return if any life threatening symptoms occur.  Discharge and followup instructions reviewed. Ambulates safely. 
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
## 2019-03-23 ENCOUNTER — Other Ambulatory Visit: Payer: Self-pay | Admitting: Certified Nurse Midwife

## 2019-03-23 DIAGNOSIS — Z130 Encounter for screening for diseases of the blood and blood-forming organs and certain disorders involving the immune mechanism: Secondary | ICD-10-CM

## 2019-03-23 DIAGNOSIS — Z113 Encounter for screening for infections with a predominantly sexual mode of transmission: Secondary | ICD-10-CM

## 2019-03-23 DIAGNOSIS — Z3492 Encounter for supervision of normal pregnancy, unspecified, second trimester: Secondary | ICD-10-CM

## 2019-03-23 DIAGNOSIS — Z131 Encounter for screening for diabetes mellitus: Secondary | ICD-10-CM

## 2019-03-24 ENCOUNTER — Other Ambulatory Visit: Payer: Self-pay

## 2019-03-24 ENCOUNTER — Other Ambulatory Visit: Payer: BLUE CROSS/BLUE SHIELD

## 2019-03-24 ENCOUNTER — Ambulatory Visit (INDEPENDENT_AMBULATORY_CARE_PROVIDER_SITE_OTHER): Payer: BLUE CROSS/BLUE SHIELD | Admitting: Obstetrics and Gynecology

## 2019-03-24 VITALS — BP 111/60 | HR 71 | Wt 159.2 lb

## 2019-03-24 DIAGNOSIS — Z131 Encounter for screening for diabetes mellitus: Secondary | ICD-10-CM

## 2019-03-24 DIAGNOSIS — Z130 Encounter for screening for diseases of the blood and blood-forming organs and certain disorders involving the immune mechanism: Secondary | ICD-10-CM

## 2019-03-24 DIAGNOSIS — Z3492 Encounter for supervision of normal pregnancy, unspecified, second trimester: Secondary | ICD-10-CM

## 2019-03-24 DIAGNOSIS — Z113 Encounter for screening for infections with a predominantly sexual mode of transmission: Secondary | ICD-10-CM

## 2019-03-24 LAB — POCT URINALYSIS DIPSTICK OB
Bilirubin, UA: NEGATIVE
Blood, UA: NEGATIVE
Glucose, UA: NEGATIVE
Ketones, UA: NEGATIVE
Leukocytes, UA: NEGATIVE
POC,PROTEIN,UA: NEGATIVE
Spec Grav, UA: 1.015 (ref 1.010–1.025)
Urobilinogen, UA: 0.2 E.U./dL
pH, UA: 7.5 (ref 5.0–8.0)

## 2019-03-24 NOTE — Progress Notes (Signed)
ROB- pt is doing well, glucola done today, BC signed
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
## 2019-03-25 LAB — CBC
Hematocrit: 34.5 % (ref 34.0–46.6)
Hemoglobin: 12.1 g/dL (ref 11.1–15.9)
MCH: 31.6 pg (ref 26.6–33.0)
MCHC: 35.1 g/dL (ref 31.5–35.7)
MCV: 90 fL (ref 79–97)
Platelets: 157 10*3/uL (ref 150–450)
RBC: 3.83 x10E6/uL (ref 3.77–5.28)
RDW: 12.2 % (ref 11.7–15.4)
WBC: 5.4 10*3/uL (ref 3.4–10.8)

## 2019-03-25 LAB — RPR: RPR Ser Ql: NONREACTIVE

## 2019-03-25 LAB — GLUCOSE, 1 HOUR GESTATIONAL: Gestational Diabetes Screen: 73 mg/dL (ref 65–139)

## 2019-03-26 ENCOUNTER — Encounter: Payer: Self-pay | Admitting: *Deleted

## 2019-04-01 ENCOUNTER — Encounter: Payer: Self-pay | Admitting: *Deleted

## 2019-04-01 ENCOUNTER — Other Ambulatory Visit: Payer: Self-pay | Admitting: Obstetrics and Gynecology

## 2019-04-23 ENCOUNTER — Other Ambulatory Visit: Payer: Self-pay

## 2019-04-23 ENCOUNTER — Ambulatory Visit (INDEPENDENT_AMBULATORY_CARE_PROVIDER_SITE_OTHER): Payer: BC Managed Care – PPO | Admitting: Obstetrics and Gynecology

## 2019-04-23 VITALS — BP 112/58 | HR 76 | Wt 161.9 lb

## 2019-04-23 DIAGNOSIS — Z3493 Encounter for supervision of normal pregnancy, unspecified, third trimester: Secondary | ICD-10-CM

## 2019-04-23 DIAGNOSIS — Z23 Encounter for immunization: Secondary | ICD-10-CM

## 2019-04-23 LAB — POCT URINALYSIS DIPSTICK OB
Bilirubin, UA: NEGATIVE
Blood, UA: NEGATIVE
Glucose, UA: NEGATIVE
Ketones, UA: NEGATIVE
Leukocytes, UA: NEGATIVE
Nitrite, UA: NEGATIVE
POC,PROTEIN,UA: NEGATIVE
Spec Grav, UA: 1.015 (ref 1.010–1.025)
Urobilinogen, UA: 0.2 E.U./dL
pH, UA: 7 (ref 5.0–8.0)

## 2019-04-23 MED ORDER — TETANUS-DIPHTH-ACELL PERTUSSIS 5-2.5-18.5 LF-MCG/0.5 IM SUSP
0.5000 mL | Freq: Once | INTRAMUSCULAR | Status: AC
  Administered 2019-04-23 (×2): 0.5 mL via INTRAMUSCULAR

## 2019-04-23 NOTE — Progress Notes (Signed)
ROB- pt is doing well 

## 2019-04-23 NOTE — Patient Instructions (Signed)
Third Trimester of Pregnancy The third trimester is from week 28 through week 40 (months 7 through 9). The third trimester is a time when the unborn baby (fetus) is growing rapidly. At the end of the ninth month, the fetus is about 20 inches in length and weighs 6-10 pounds. Body changes during your third trimester Your body will continue to go through many changes during pregnancy. The changes vary from woman to woman. During the third trimester:  Your weight will continue to increase. You can expect to gain 25-35 pounds (11-16 kg) by the end of the pregnancy.  You may begin to get stretch marks on your hips, abdomen, and breasts.  You may urinate more often because the fetus is moving lower into your pelvis and pressing on your bladder.  You may develop or continue to have heartburn. This is caused by increased hormones that slow down muscles in the digestive tract.  You may develop or continue to have constipation because increased hormones slow digestion and cause the muscles that push waste through your intestines to relax.  You may develop hemorrhoids. These are swollen veins (varicose veins) in the rectum that can itch or be painful.  You may develop swollen, bulging veins (varicose veins) in your legs.  You may have increased body aches in the pelvis, back, or thighs. This is due to weight gain and increased hormones that are relaxing your joints.  You may have changes in your hair. These can include thickening of your hair, rapid growth, and changes in texture. Some women also have hair loss during or after pregnancy, or hair that feels dry or thin. Your hair will most likely return to normal after your baby is born.  Your breasts will continue to grow and they will continue to become tender. A yellow fluid (colostrum) may leak from your breasts. This is the first milk you are producing for your baby.  Your belly button may stick out.  You may notice more swelling in your hands,  face, or ankles.  You may have increased tingling or numbness in your hands, arms, and legs. The skin on your belly may also feel numb.  You may feel short of breath because of your expanding uterus.  You may have more problems sleeping. This can be caused by the size of your belly, increased need to urinate, and an increase in your body's metabolism.  You may notice the fetus "dropping," or moving lower in your abdomen (lightening).  You may have increased vaginal discharge.  You may notice your joints feel loose and you may have pain around your pelvic bone. What to expect at prenatal visits You will have prenatal exams every 2 weeks until week 36. Then you will have weekly prenatal exams. During a routine prenatal visit:  You will be weighed to make sure you and the baby are growing normally.  Your blood pressure will be taken.  Your abdomen will be measured to track your baby's growth.  The fetal heartbeat will be listened to.  Any test results from the previous visit will be discussed.  You may have a cervical check near your due date to see if your cervix has softened or thinned (effaced).  You will be tested for Group B streptococcus. This happens between 35 and 37 weeks. Your health care provider may ask you:  What your birth plan is.  How you are feeling.  If you are feeling the baby move.  If you have had any abnormal   symptoms, such as leaking fluid, bleeding, severe headaches, or abdominal cramping.  If you are using any tobacco products, including cigarettes, chewing tobacco, and electronic cigarettes.  If you have any questions. Other tests or screenings that may be performed during your third trimester include:  Blood tests that check for low iron levels (anemia).  Fetal testing to check the health, activity level, and growth of the fetus. Testing is done if you have certain medical conditions or if there are problems during the pregnancy.  Nonstress test  (NST). This test checks the health of your baby to make sure there are no signs of problems, such as the baby not getting enough oxygen. During this test, a belt is placed around your belly. The baby is made to move, and its heart rate is monitored during movement. What is false labor? False labor is a condition in which you feel small, irregular tightenings of the muscles in the womb (contractions) that usually go away with rest, changing position, or drinking water. These are called Braxton Hicks contractions. Contractions may last for hours, days, or even weeks before true labor sets in. If contractions come at regular intervals, become more frequent, increase in intensity, or become painful, you should see your health care provider. What are the signs of labor?  Abdominal cramps.  Regular contractions that start at 10 minutes apart and become stronger and more frequent with time.  Contractions that start on the top of the uterus and spread down to the lower abdomen and back.  Increased pelvic pressure and dull back pain.  A watery or bloody mucus discharge that comes from the vagina.  Leaking of amniotic fluid. This is also known as your "water breaking." It could be a slow trickle or a gush. Let your health care provider know if it has a color or strange odor. If you have any of these signs, call your health care provider right away, even if it is before your due date. Follow these instructions at home: Medicines  Follow your health care provider's instructions regarding medicine use. Specific medicines may be either safe or unsafe to take during pregnancy.  Take a prenatal vitamin that contains at least 600 micrograms (mcg) of folic acid.  If you develop constipation, try taking a stool softener if your health care provider approves. Eating and drinking   Eat a balanced diet that includes fresh fruits and vegetables, whole grains, good sources of protein such as meat, eggs, or tofu,  and low-fat dairy. Your health care provider will help you determine the amount of weight gain that is right for you.  Avoid raw meat and uncooked cheese. These carry germs that can cause birth defects in the baby.  If you have low calcium intake from food, talk to your health care provider about whether you should take a daily calcium supplement.  Eat four or five small meals rather than three large meals a day.  Limit foods that are high in fat and processed sugars, such as fried and sweet foods.  To prevent constipation: ? Drink enough fluid to keep your urine clear or pale yellow. ? Eat foods that are high in fiber, such as fresh fruits and vegetables, whole grains, and beans. Activity  Exercise only as directed by your health care provider. Most women can continue their usual exercise routine during pregnancy. Try to exercise for 30 minutes at least 5 days a week. Stop exercising if you experience uterine contractions.  Avoid heavy lifting.  Do   not exercise in extreme heat or humidity, or at high altitudes.  Wear low-heel, comfortable shoes.  Practice good posture.  You may continue to have sex unless your health care provider tells you otherwise. Relieving pain and discomfort  Take frequent breaks and rest with your legs elevated if you have leg cramps or low back pain.  Take warm sitz baths to soothe any pain or discomfort caused by hemorrhoids. Use hemorrhoid cream if your health care provider approves.  Wear a good support bra to prevent discomfort from breast tenderness.  If you develop varicose veins: ? Wear support pantyhose or compression stockings as told by your healthcare provider. ? Elevate your feet for 15 minutes, 3-4 times a day. Prenatal care  Write down your questions. Take them to your prenatal visits.  Keep all your prenatal visits as told by your health care provider. This is important. Safety  Wear your seat belt at all times when driving.  Make  a list of emergency phone numbers, including numbers for family, friends, the hospital, and police and fire departments. General instructions  Avoid cat litter boxes and soil used by cats. These carry germs that can cause birth defects in the baby. If you have a cat, ask someone to clean the litter box for you.  Do not travel far distances unless it is absolutely necessary and only with the approval of your health care provider.  Do not use hot tubs, steam rooms, or saunas.  Do not drink alcohol.  Do not use any products that contain nicotine or tobacco, such as cigarettes and e-cigarettes. If you need help quitting, ask your health care provider.  Do not use any medicinal herbs or unprescribed drugs. These chemicals affect the formation and growth of the baby.  Do not douche or use tampons or scented sanitary pads.  Do not cross your legs for long periods of time.  To prepare for the arrival of your baby: ? Take prenatal classes to understand, practice, and ask questions about labor and delivery. ? Make a trial run to the hospital. ? Visit the hospital and tour the maternity area. ? Arrange for maternity or paternity leave through employers. ? Arrange for family and friends to take care of pets while you are in the hospital. ? Purchase a rear-facing car seat and make sure you know how to install it in your car. ? Pack your hospital bag. ? Prepare the baby's nursery. Make sure to remove all pillows and stuffed animals from the baby's crib to prevent suffocation.  Visit your dentist if you have not gone during your pregnancy. Use a soft toothbrush to brush your teeth and be gentle when you floss. Contact a health care provider if:  You are unsure if you are in labor or if your water has broken.  You become dizzy.  You have mild pelvic cramps, pelvic pressure, or nagging pain in your abdominal area.  You have lower back pain.  You have persistent nausea, vomiting, or diarrhea.   You have an unusual or bad smelling vaginal discharge.  You have pain when you urinate. Get help right away if:  Your water breaks before 37 weeks.  You have regular contractions less than 5 minutes apart before 37 weeks.  You have a fever.  You are leaking fluid from your vagina.  You have spotting or bleeding from your vagina.  You have severe abdominal pain or cramping.  You have rapid weight loss or weight gain.  You have   shortness of breath with chest pain.  You notice sudden or extreme swelling of your face, hands, ankles, feet, or legs.  Your baby makes fewer than 10 movements in 2 hours.  You have severe headaches that do not go away when you take medicine.  You have vision changes. Summary  The third trimester is from week 28 through week 40, months 7 through 9. The third trimester is a time when the unborn baby (fetus) is growing rapidly.  During the third trimester, your discomfort may increase as you and your baby continue to gain weight. You may have abdominal, leg, and back pain, sleeping problems, and an increased need to urinate.  During the third trimester your breasts will keep growing and they will continue to become tender. A yellow fluid (colostrum) may leak from your breasts. This is the first milk you are producing for your baby.  False labor is a condition in which you feel small, irregular tightenings of the muscles in the womb (contractions) that eventually go away. These are called Braxton Hicks contractions. Contractions may last for hours, days, or even weeks before true labor sets in.  Signs of labor can include: abdominal cramps; regular contractions that start at 10 minutes apart and become stronger and more frequent with time; watery or bloody mucus discharge that comes from the vagina; increased pelvic pressure and dull back pain; and leaking of amniotic fluid. This information is not intended to replace advice given to you by your health  care provider. Make sure you discuss any questions you have with your health care provider. Document Released: 10/02/2001 Document Revised: 01/29/2019 Document Reviewed: 11/13/2016 Elsevier Patient Education  2020 Else[employer] Inc.  

## 2019-04-23 NOTE — Progress Notes (Signed)
ROB- doing well, occasional lightheadedness when working. MGF just passed away. Tdap given.

## 2019-04-29 ENCOUNTER — Encounter: Payer: BLUE CROSS/BLUE SHIELD | Admitting: Obstetrics and Gynecology

## 2019-05-08 ENCOUNTER — Encounter: Payer: BC Managed Care – PPO | Admitting: Obstetrics and Gynecology

## 2019-05-13 ENCOUNTER — Telehealth: Payer: Self-pay

## 2019-05-13 NOTE — Telephone Encounter (Signed)
Coronavirus (COVID-19) Are you at risk?  Are you at risk for the Coronavirus (COVID-19)?  To be considered HIGH RISK for Coronavirus (COVID-19), you have to meet the following criteria:  . Traveled to [place], [place], [place], [place] or [place]; or in the [place] to [place], [place], [place], or [place]; and have fever, cough, and shortness of breath within the last 2 weeks of travel OR . Been in close contact with a person diagnosed with COVID-19 within the last 2 weeks and have fever, cough, and shortness of breath . IF YOU DO NOT MEET THESE CRITERIA, YOU ARE CONSIDERED LOW RISK FOR COVID-19.  What to do if you are HIGH RISK for COVID-19?  . If you are having a medical emergency, call 911. . Seek medical care right away. Before you go to a doctor's office, urgent care or emergency department, call ahead and tell them about your recent travel, contact with someone diagnosed with COVID-19, and your symptoms. You should receive instructions from your physician's office regarding next steps of care.  . When you arrive at healthcare provider, tell the healthcare staff immediately you have returned from visiting [place], [place], [place], [place] or [place]; or traveled in the [place] to [place], [place], [place], or [place]; in the last two weeks or you have been in close contact with a person diagnosed with COVID-19 in the last 2 weeks.   . Tell the health care staff about your symptoms: fever, cough and shortness of breath. . After you have been seen by a medical provider, you will be either: o Tested for (COVID-19) and discharged home on quarantine except to seek medical care if symptoms worsen, and asked to  - Stay home and avoid contact with others until you get your results (4-5 days)  - Avoid travel on public transportation if possible (such as bus, train, or airplane) or o Sent to the Emergency Department by EMS for evaluation, COVID-19 testing, and possible  admission depending on your condition and test results.  What to do if you are LOW RISK for COVID-19?  Reduce your risk of any infection by using the same precautions used for avoiding the common cold or flu:  . Wash your hands often with soap and warm water for at least 20 seconds.  If soap and water are not readily available, use an alcohol-based hand sanitizer with at least 60% alcohol.  . If coughing or sneezing, cover your mouth and nose by coughing or sneezing into the elbow areas of your shirt or coat, into a tissue or into your sleeve (not your hands). . Avoid shaking hands with others and consider head nods or verbal greetings only. . Avoid touching your eyes, nose, or mouth with unwashed hands.  . Avoid close contact with people who are Stephanie May. . Avoid places or events with large numbers of people in one location, like concerts or sporting events. . Carefully consider travel plans you have or are making. . If you are planning any travel outside or inside the [place], visit the CDC's Travelers' Health webpage for the latest health notices. . If you have some symptoms but not all symptoms, continue to monitor at home and seek medical attention if your symptoms worsen. . If you are having a medical emergency, call 911.  05/13/19 SCREENING NEG SLS ADDITIONAL HEALTHCARE OPTIONS FOR PATIENTS  [facility] / e-Visit: [email or web address]         [facility]: [phone]    [facility]: [phone]                   [facility]: [phone]  

## 2019-05-14 ENCOUNTER — Other Ambulatory Visit: Payer: Self-pay

## 2019-05-14 ENCOUNTER — Other Ambulatory Visit (HOSPITAL_COMMUNITY)
Admission: RE | Admit: 2019-05-14 | Discharge: 2019-05-14 | Disposition: A | Discharge status: Home / Self Care | Payer: BC Managed Care – PPO | Source: Ambulatory Visit | Attending: Certified Nurse Midwife | Admitting: Certified Nurse Midwife

## 2019-05-14 ENCOUNTER — Ambulatory Visit (INDEPENDENT_AMBULATORY_CARE_PROVIDER_SITE_OTHER): Payer: BC Managed Care – PPO | Admitting: Certified Nurse Midwife

## 2019-05-14 VITALS — BP 107/56 | HR 67 | Wt 161.9 lb

## 2019-05-14 DIAGNOSIS — N898 Other specified noninflammatory disorders of vagina: Secondary | ICD-10-CM | POA: Diagnosis present

## 2019-05-14 DIAGNOSIS — O26893 Other specified pregnancy related conditions, third trimester: Secondary | ICD-10-CM | POA: Insufficient documentation

## 2019-05-14 DIAGNOSIS — Z3493 Encounter for supervision of normal pregnancy, unspecified, third trimester: Secondary | ICD-10-CM

## 2019-05-14 DIAGNOSIS — R102 Pelvic and perineal pain: Secondary | ICD-10-CM | POA: Diagnosis present

## 2019-05-14 LAB — POCT URINALYSIS DIPSTICK OB
Bilirubin, UA: NEGATIVE
Blood, UA: NEGATIVE
Glucose, UA: NEGATIVE
Ketones, UA: NEGATIVE
Nitrite, UA: NEGATIVE
Spec Grav, UA: 1.015 (ref 1.010–1.025)
Urobilinogen, UA: 0.2 E.U./dL
pH, UA: 6.5 (ref 5.0–8.0)

## 2019-05-14 NOTE — Progress Notes (Signed)
ROB-Patient c/o intermittent abdominal cramping and pelvic pressure x2 weeks.

## 2019-05-14 NOTE — Progress Notes (Signed)
ROB-Reports intermittent pelvic pressure x two (2) weeks. No change in vaginal discharge. Swab collected, see orders. Cervix visually closed. Discussed home treatment measures including use of abdominal support. Anticipatory guidance regarding course of prenatal care. Reviewed red flag symptoms and when to call. RTC x 2 weeks for ROB or sooner if needed.

## 2019-05-14 NOTE — Patient Instructions (Signed)
Fetal Movement Counts Patient Name: ________________________________________________ Patient Due Date: ____________________ What is a fetal movement count?  A fetal movement count is the number of times that you feel your baby move during a certain amount of time. This may also be called a fetal kick count. A fetal movement count is recommended for every pregnant woman. You may be asked to start counting fetal movements as early as week 28 of your pregnancy. Pay attention to when your baby is most active. You may notice your baby's sleep and wake cycles. You may also notice things that make your baby move more. You should do a fetal movement count:  When your baby is normally most active.  At the same time each day. A good time to count movements is while you are resting, after having something to eat and drink. How do I count fetal movements? 1. Find a quiet, comfortable area. Sit, or lie down on your side. 2. Write down the date, the start time and stop time, and the number of movements that you felt between those two times. Take this information with you to your health care visits. 3. For 2 hours, count kicks, flutters, swishes, rolls, and jabs. You should feel at least 10 movements during 2 hours. 4. You may stop counting after you have felt 10 movements. 5. If you do not feel 10 movements in 2 hours, have something to eat and drink. Then, keep resting and counting for 1 hour. If you feel at least 4 movements during that hour, you may stop counting. Contact a health care provider if:  You feel fewer than 4 movements in 2 hours.  Your baby is not moving like he or she usually does. Date: ____________ Start time: ____________ Stop time: ____________ Movements: ____________ Date: ____________ Start time: ____________ Stop time: ____________ Movements: ____________ Date: ____________ Start time: ____________ Stop time: ____________ Movements: ____________ Date: ____________ Start time:  ____________ Stop time: ____________ Movements: ____________ Date: ____________ Start time: ____________ Stop time: ____________ Movements: ____________ Date: ____________ Start time: ____________ Stop time: ____________ Movements: ____________ Date: ____________ Start time: ____________ Stop time: ____________ Movements: ____________ Date: ____________ Start time: ____________ Stop time: ____________ Movements: ____________ Date: ____________ Start time: ____________ Stop time: ____________ Movements: ____________ This information is not intended to replace advice given to you by your health care provider. Make sure you discuss any questions you have with your health care provider. Document Released: 11/07/2006 Document Revised: 10/28/2018 Document Reviewed: 11/17/2015 Elsevier Patient Education  [place]. Round Ligament Pain  The round ligament is a cord of muscle and tissue that helps support the uterus. It can become a source of pain during pregnancy if it becomes stretched or twisted as the baby grows. The pain usually begins in the second trimester (13-28 weeks) of pregnancy, and it can come and go until the baby is delivered. It is not a serious problem, and it does not cause harm to the baby. Round ligament pain is usually a short, sharp, and pinching pain, but it can also be a dull, lingering, and aching pain. The pain is felt in the lower side of the abdomen or in the groin. It usually starts deep in the groin and moves up to the outside of the hip area. The pain may occur when you:  Suddenly change position, such as quickly going from a sitting to standing position.  Roll over in bed.  Cough or sneeze.  Do physical activity. Follow these instructions at home:  Watch your condition for any changes.  When the pain starts, relax. Then try any of these methods to help with the pain: ? Sitting down. ? Flexing your knees up to your abdomen. ? Lying on your side with one  pillow under your abdomen and another pillow between your legs. ? Sitting in a warm bath for 15-20 minutes or until the pain goes away.  Take over-the-counter and prescription medicines only as told by your health care provider.  Move slowly when you sit down or stand up.  Avoid long walks if they cause pain.  Stop or reduce your physical activities if they cause pain.  Keep all follow-up visits as told by your health care provider. This is important. Contact a health care provider if:  Your pain does not go away with treatment.  You feel pain in your back that you did not have before.  Your medicine is not helping. Get help right away if:  You have a fever or chills.  You develop uterine contractions.  You have vaginal bleeding.  You have nausea or vomiting.  You have diarrhea.  You have pain when you urinate. Summary  Round ligament pain is felt in the lower abdomen or groin. It is usually a short, sharp, and pinching pain. It can also be a dull, lingering, and aching pain.  This pain usually begins in the second trimester (13-28 weeks). It occurs because the uterus is stretching with the growing baby, and it is not harmful to the baby.  You may notice the pain when you suddenly change position, when you cough or sneeze, or during physical activity.  Relaxing, flexing your knees to your abdomen, lying on one side, or taking a warm bath may help to get rid of the pain.  Get help from your health care provider if the pain does not go away or if you have vaginal bleeding, nausea, vomiting, diarrhea, or painful urination. This information is not intended to replace advice given to you by your health care provider. Make sure you discuss any questions you have with your health care provider. Document Released: 07/17/2008 Document Revised: 03/26/2018 Document Reviewed: 03/26/2018 Elsevier Patient Education  2020 Elsevier Inc.  

## 2019-05-18 ENCOUNTER — Other Ambulatory Visit: Payer: Self-pay | Admitting: Certified Nurse Midwife

## 2019-05-18 DIAGNOSIS — B373 Candidiasis of vulva and vagina: Secondary | ICD-10-CM

## 2019-05-18 DIAGNOSIS — B3731 Acute candidiasis of vulva and vagina: Secondary | ICD-10-CM

## 2019-05-18 LAB — CERVICOVAGINAL ANCILLARY ONLY
Bacterial vaginitis: NEGATIVE
Candida vaginitis: POSITIVE — AB
Trichomonas: NEGATIVE

## 2019-05-18 MED ORDER — TERCONAZOLE 0.4 % VA CREA: 1.0000 | TOPICAL_CREAM | Freq: Every day | VAGINAL | 0 refills | Status: DC

## 2019-05-27 ENCOUNTER — Ambulatory Visit (INDEPENDENT_AMBULATORY_CARE_PROVIDER_SITE_OTHER): Payer: BC Managed Care – PPO | Admitting: Obstetrics and Gynecology

## 2019-05-27 ENCOUNTER — Other Ambulatory Visit: Payer: Self-pay

## 2019-05-27 VITALS — BP 115/78 | HR 78 | Wt 164.8 lb

## 2019-05-27 DIAGNOSIS — Z3493 Encounter for supervision of normal pregnancy, unspecified, third trimester: Secondary | ICD-10-CM

## 2019-05-27 NOTE — Progress Notes (Signed)
ROB- pt is having some pelvic pressure, otherwise she is doing well

## 2019-05-27 NOTE — Progress Notes (Signed)
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

## 2019-06-12 ENCOUNTER — Ambulatory Visit (INDEPENDENT_AMBULATORY_CARE_PROVIDER_SITE_OTHER): Payer: BC Managed Care – PPO | Admitting: Obstetrics and Gynecology

## 2019-06-12 ENCOUNTER — Other Ambulatory Visit: Payer: Self-pay

## 2019-06-12 VITALS — BP 114/60 | HR 73 | Wt 163.1 lb

## 2019-06-12 DIAGNOSIS — Z113 Encounter for screening for infections with a predominantly sexual mode of transmission: Secondary | ICD-10-CM

## 2019-06-12 DIAGNOSIS — Z3685 Encounter for antenatal screening for Streptococcus B: Secondary | ICD-10-CM

## 2019-06-12 DIAGNOSIS — Z3493 Encounter for supervision of normal pregnancy, unspecified, third trimester: Secondary | ICD-10-CM

## 2019-06-12 LAB — POCT URINALYSIS DIPSTICK OB
Bilirubin, UA: NEGATIVE
Blood, UA: NEGATIVE
Glucose, UA: NEGATIVE
Ketones, UA: NEGATIVE
Leukocytes, UA: NEGATIVE
Nitrite, UA: NEGATIVE
POC,PROTEIN,UA: NEGATIVE
Spec Grav, UA: 1.015 (ref 1.010–1.025)
Urobilinogen, UA: 0.2 E.U./dL
pH, UA: 7 (ref 5.0–8.0)

## 2019-06-12 NOTE — Progress Notes (Signed)
ROB- cultures obtained, pt is having pelvic pressure, some lower abd cramping

## 2019-06-12 NOTE — Progress Notes (Signed)
RO  With cultures- discussed [facility] and labor precautions, discussed covid restrictions

## 2019-06-14 LAB — STREP GP B NAA: Strep Gp B NAA: NEGATIVE

## 2019-06-16 ENCOUNTER — Other Ambulatory Visit: Payer: Self-pay

## 2019-06-16 ENCOUNTER — Ambulatory Visit (INDEPENDENT_AMBULATORY_CARE_PROVIDER_SITE_OTHER): Payer: BC Managed Care – PPO | Admitting: Obstetrics and Gynecology

## 2019-06-16 VITALS — BP 107/57 | HR 76 | Wt 166.3 lb

## 2019-06-16 DIAGNOSIS — Z3493 Encounter for supervision of normal pregnancy, unspecified, third trimester: Secondary | ICD-10-CM

## 2019-06-16 LAB — POCT URINALYSIS DIPSTICK OB
Bilirubin, UA: NEGATIVE
Blood, UA: NEGATIVE
Glucose, UA: NEGATIVE
Ketones, UA: NEGATIVE
Leukocytes, UA: NEGATIVE
Nitrite, UA: NEGATIVE
POC,PROTEIN,UA: NEGATIVE
Spec Grav, UA: 1.015 (ref 1.010–1.025)
Urobilinogen, UA: 0.2 E.U./dL
pH, UA: 6.5 (ref 5.0–8.0)

## 2019-06-16 NOTE — Progress Notes (Signed)
ROB-pt is doing well, having pelvic pressure

## 2019-06-16 NOTE — Progress Notes (Signed)
ROB-doing well, no changes.

## 2019-06-17 LAB — GC/CHLAMYDIA PROBE AMP
Chlamydia trachomatis, NAA: NEGATIVE
Neisseria Gonorrhoeae by PCR: NEGATIVE

## 2019-06-18 ENCOUNTER — Encounter: Payer: BC Managed Care – PPO | Admitting: Obstetrics and Gynecology

## 2019-06-26 ENCOUNTER — Ambulatory Visit (INDEPENDENT_AMBULATORY_CARE_PROVIDER_SITE_OTHER): Payer: BC Managed Care – PPO | Admitting: Obstetrics and Gynecology

## 2019-06-26 ENCOUNTER — Other Ambulatory Visit: Payer: Self-pay

## 2019-06-26 VITALS — BP 103/66 | HR 74 | Wt 167.7 lb

## 2019-06-26 DIAGNOSIS — Z3493 Encounter for supervision of normal pregnancy, unspecified, third trimester: Secondary | ICD-10-CM

## 2019-06-26 LAB — POCT URINALYSIS DIPSTICK OB
Bilirubin, UA: NEGATIVE
Blood, UA: NEGATIVE
Glucose, UA: NEGATIVE
Ketones, UA: NEGATIVE
Leukocytes, UA: NEGATIVE
Nitrite, UA: NEGATIVE
POC,PROTEIN,UA: NEGATIVE
Spec Grav, UA: 1.01 (ref 1.010–1.025)
Urobilinogen, UA: 0.2 E.U./dL
pH, UA: 7 (ref 5.0–8.0)

## 2019-06-26 NOTE — Progress Notes (Signed)
ROB- doing well, labor precautions discussed. Ready.[email or web address] video done and questions answered.

## 2019-06-26 NOTE — Progress Notes (Signed)
ROB-pt is c/o pelvic pressure

## 2019-07-03 ENCOUNTER — Encounter: Payer: BC Managed Care – PPO | Admitting: Obstetrics and Gynecology

## 2019-07-03 ENCOUNTER — Ambulatory Visit (INDEPENDENT_AMBULATORY_CARE_PROVIDER_SITE_OTHER): Payer: BC Managed Care – PPO | Admitting: Certified Nurse Midwife

## 2019-07-03 ENCOUNTER — Encounter: Payer: Self-pay | Admitting: Certified Nurse Midwife

## 2019-07-03 ENCOUNTER — Other Ambulatory Visit: Payer: Self-pay

## 2019-07-03 VITALS — BP 114/64 | HR 65 | Wt 169.2 lb

## 2019-07-03 DIAGNOSIS — Z23 Encounter for immunization: Secondary | ICD-10-CM | POA: Diagnosis not present

## 2019-07-03 DIAGNOSIS — Z3483 Encounter for supervision of other normal pregnancy, third trimester: Secondary | ICD-10-CM

## 2019-07-03 LAB — POCT URINALYSIS DIPSTICK OB
Bilirubin, UA: NEGATIVE
Blood, UA: NEGATIVE
Glucose, UA: NEGATIVE
Ketones, UA: NEGATIVE
Leukocytes, UA: NEGATIVE
Nitrite, UA: NEGATIVE
POC,PROTEIN,UA: NEGATIVE
Spec Grav, UA: 1.02 (ref 1.010–1.025)
Urobilinogen, UA: 0.2 E.U./dL
pH, UA: 6 (ref 5.0–8.0)

## 2019-07-03 NOTE — Addendum Note (Signed)
Addended by: Keturah Barre L on: 07/03/2019 05:04 PM   Modules accepted: Orders

## 2019-07-03 NOTE — Progress Notes (Signed)
ROB doing well. Feels movement. Labor precautions reviewed. Discussed induction @ 41 wks. Follow up 1 wk for BPP/growth and ROB with melody .   Philip Aspen, CNM

## 2019-07-03 NOTE — Addendum Note (Signed)
Addended by: Raliegh Ip on: 07/03/2019 06:07 PM   Modules accepted: Orders

## 2019-07-03 NOTE — Patient Instructions (Signed)
Braxton Hicks Contractions Contractions of the uterus can occur throughout pregnancy, but they are not always a sign that you are in labor. You may have practice contractions called Braxton Hicks contractions. These false labor contractions are sometimes confused with true labor. What are Braxton Hicks contractions? Braxton Hicks contractions are tightening movements that occur in the muscles of the uterus before labor. Unlike true labor contractions, these contractions do not result in opening (dilation) and thinning of the cervix. Toward the end of pregnancy (32-34 weeks), Braxton Hicks contractions can happen more often and may become stronger. These contractions are sometimes difficult to tell apart from true labor because they can be very uncomfortable. You should not feel embarrassed if you go to the hospital with false labor. Sometimes, the only way to tell if you are in true labor is for your health care provider to look for changes in the cervix. The health care provider will do a physical exam and may monitor your contractions. If you are not in true labor, the exam should show that your cervix is not dilating and your water has not broken. If there are no other health problems associated with your pregnancy, it is completely safe for you to be sent home with false labor. You may continue to have Braxton Hicks contractions until you go into true labor. How to tell the difference between true labor and false labor True labor  Contractions last 30-70 seconds.  Contractions become very regular.  Discomfort is usually felt in the top of the uterus, and it spreads to the lower abdomen and low back.  Contractions do not go away with walking.  Contractions usually become more intense and increase in frequency.  The cervix dilates and gets thinner. False labor  Contractions are usually shorter and not as strong as true labor contractions.  Contractions are usually irregular.  Contractions  are often felt in the front of the lower abdomen and in the groin.  Contractions may go away when you walk around or change positions while lying down.  Contractions get weaker and are shorter-lasting as time goes on.  The cervix usually does not dilate or become thin. Follow these instructions at home:   Take over-the-counter and prescription medicines only as told by your health care provider.  Keep up with your usual exercises and follow other instructions from your health care provider.  Eat and drink lightly if you think you are going into labor.  If Braxton Hicks contractions are making you uncomfortable: ? Change your position from lying down or resting to walking, or change from walking to resting. ? Sit and rest in a tub of warm water. ? Drink enough fluid to keep your urine pale yellow. Dehydration may cause these contractions. ? Do slow and deep breathing several times an hour.  Keep all follow-up prenatal visits as told by your health care provider. This is important. Contact a health care provider if:  You have a fever.  You have continuous pain in your abdomen. Get help right away if:  Your contractions become stronger, more regular, and closer together.  You have fluid leaking or gushing from your vagina.  You pass blood-tinged mucus (bloody show).  You have bleeding from your vagina.  You have low back pain that you never had before.  You feel your baby's head pushing down and causing pelvic pressure.  Your baby is not moving inside you as much as it used to. Summary  Contractions that occur before labor are   called Braxton Hicks contractions, false labor, or practice contractions.  Braxton Hicks contractions are usually shorter, weaker, farther apart, and less regular than true labor contractions. True labor contractions usually become progressively stronger and regular, and they become more frequent.  Manage discomfort from Braxton Hicks contractions  by changing position, resting in a warm bath, drinking plenty of water, or practicing deep breathing. This information is not intended to replace advice given to you by your health care provider. Make sure you discuss any questions you have with your health care provider. Document Released: 02/21/2017 Document Revised: 09/20/2017 Document Reviewed: 02/21/2017 Elsevier Patient Education  2020 Elsevier Inc[employer]  

## 2019-07-05 ENCOUNTER — Other Ambulatory Visit: Payer: Self-pay

## 2019-07-05 ENCOUNTER — Inpatient Hospital Stay
Admission: EM | Admit: 2019-07-05 | Discharge: 2019-07-06 | DRG: 807 | Disposition: A | Discharge status: Home / Self Care | Payer: BC Managed Care – PPO | Attending: Obstetrics and Gynecology | Admitting: Obstetrics and Gynecology

## 2019-07-05 ENCOUNTER — Encounter: Payer: Self-pay | Admitting: *Deleted

## 2019-07-05 DIAGNOSIS — Z23 Encounter for immunization: Secondary | ICD-10-CM

## 2019-07-05 DIAGNOSIS — Z3A39 39 weeks gestation of pregnancy: Secondary | ICD-10-CM

## 2019-07-05 DIAGNOSIS — Z20828 Contact with and (suspected) exposure to other viral communicable diseases: Secondary | ICD-10-CM | POA: Diagnosis present

## 2019-07-05 DIAGNOSIS — Z3490 Encounter for supervision of normal pregnancy, unspecified, unspecified trimester: Secondary | ICD-10-CM

## 2019-07-05 DIAGNOSIS — O26893 Other specified pregnancy related conditions, third trimester: Secondary | ICD-10-CM | POA: Diagnosis present

## 2019-07-05 LAB — CBC
HCT: 32.7 % — ABNORMAL LOW (ref 36.0–46.0)
Hemoglobin: 11.1 g/dL — ABNORMAL LOW (ref 12.0–15.0)
MCH: 28.9 pg (ref 26.0–34.0)
MCHC: 33.9 g/dL (ref 30.0–36.0)
MCV: 85.2 fL (ref 80.0–100.0)
Platelets: 149 10*3/uL — ABNORMAL LOW (ref 150–400)
RBC: 3.84 MIL/uL — ABNORMAL LOW (ref 3.87–5.11)
RDW: 12.9 % (ref 11.5–15.5)
WBC: 9 10*3/uL (ref 4.0–10.5)
nRBC: 0 % (ref 0.0–0.2)

## 2019-07-05 LAB — TYPE AND SCREEN
ABO/RH(D): O POS
Antibody Screen: NEGATIVE

## 2019-07-05 LAB — SARS CORONAVIRUS 2 BY RT PCR (HOSPITAL ORDER, PERFORMED IN ~~LOC~~ HOSPITAL LAB): SARS Coronavirus 2: NEGATIVE

## 2019-07-05 LAB — RPR: RPR Ser Ql: NONREACTIVE

## 2019-07-05 MED ORDER — ONDANSETRON HCL 4 MG/2ML IJ SOLN: 4.0000 mg | INTRAMUSCULAR | Status: DC | PRN

## 2019-07-05 MED ORDER — BENZOCAINE-MENTHOL 20-0.5 % EX AERO: 1.0000 "application " | INHALATION_SPRAY | CUTANEOUS | Status: DC | PRN

## 2019-07-05 MED ORDER — BUTORPHANOL TARTRATE 1 MG/ML IJ SOLN
2.0000 mg | Freq: Once | INTRAMUSCULAR | Status: AC
  Administered 2019-07-05: 04:00:00 2 mg via INTRAMUSCULAR

## 2019-07-05 MED ORDER — COCONUT OIL OIL
1.0000 "application " | TOPICAL_OIL | Status: DC | PRN
  Administered 2019-07-05: 1 via TOPICAL
  Filled 2019-07-05: qty 120

## 2019-07-05 MED ORDER — IBUPROFEN 600 MG PO TABS
600.0000 mg | ORAL_TABLET | Freq: Four times a day (QID) | ORAL | Status: DC
  Administered 2019-07-05: 600 mg via ORAL
  Filled 2019-07-05: qty 1

## 2019-07-05 MED ORDER — OXYTOCIN 10 UNIT/ML IJ SOLN
INTRAMUSCULAR | Status: AC
  Filled 2019-07-05: qty 2

## 2019-07-05 MED ORDER — DOCUSATE SODIUM 100 MG PO CAPS
100.0000 mg | ORAL_CAPSULE | Freq: Two times a day (BID) | ORAL | Status: DC
  Administered 2019-07-05: 100 mg via ORAL
  Filled 2019-07-05: qty 1

## 2019-07-05 MED ORDER — MISOPROSTOL 200 MCG PO TABS
ORAL_TABLET | ORAL | Status: AC
  Filled 2019-07-05: qty 4

## 2019-07-05 MED ORDER — LACTATED RINGERS IV SOLN: INTRAVENOUS | Status: DC

## 2019-07-05 MED ORDER — SIMETHICONE 80 MG PO CHEW: 80.0000 mg | CHEWABLE_TABLET | ORAL | Status: DC | PRN

## 2019-07-05 MED ORDER — DIPHENHYDRAMINE HCL 25 MG PO CAPS: 25.0000 mg | ORAL_CAPSULE | Freq: Four times a day (QID) | ORAL | Status: DC | PRN

## 2019-07-05 MED ORDER — OXYTOCIN BOLUS FROM INFUSION
500.0000 mL | Freq: Once | INTRAVENOUS | Status: AC
  Administered 2019-07-05: 500 mL via INTRAVENOUS

## 2019-07-05 MED ORDER — OXYTOCIN 40 UNITS IN NORMAL SALINE INFUSION - SIMPLE MED
2.5000 [IU]/h | INTRAVENOUS | Status: DC
  Administered 2019-07-05: 2.5 [IU]/h via INTRAVENOUS

## 2019-07-05 MED ORDER — VARICELLA VIRUS VACCINE LIVE 1350 PFU/0.5ML IJ SUSR
0.5000 mL | Freq: Once | INTRAMUSCULAR | Status: DC
  Filled 2019-07-05: qty 0.5

## 2019-07-05 MED ORDER — OXYCODONE-ACETAMINOPHEN 5-325 MG PO TABS: 2.0000 | ORAL_TABLET | ORAL | Status: DC | PRN

## 2019-07-05 MED ORDER — SOD CITRATE-CITRIC ACID 500-334 MG/5ML PO SOLN: 30.0000 mL | ORAL | Status: DC | PRN

## 2019-07-05 MED ORDER — LIDOCAINE HCL (PF) 1 % IJ SOLN: 30.0000 mL | INTRAMUSCULAR | Status: DC | PRN

## 2019-07-05 MED ORDER — ONDANSETRON HCL 4 MG/2ML IJ SOLN
4.0000 mg | Freq: Four times a day (QID) | INTRAMUSCULAR | Status: DC | PRN
  Administered 2019-07-05: 4 mg via INTRAVENOUS
  Filled 2019-07-05: qty 2

## 2019-07-05 MED ORDER — ACETAMINOPHEN 325 MG PO TABS
650.0000 mg | ORAL_TABLET | ORAL | Status: DC | PRN
  Administered 2019-07-05: 650 mg via ORAL
  Filled 2019-07-05: qty 2

## 2019-07-05 MED ORDER — AMMONIA AROMATIC IN INHA
RESPIRATORY_TRACT | Status: AC
  Filled 2019-07-05: qty 10

## 2019-07-05 MED ORDER — WITCH HAZEL-GLYCERIN EX PADS: 1.0000 "application " | MEDICATED_PAD | CUTANEOUS | Status: DC | PRN

## 2019-07-05 MED ORDER — DIBUCAINE (PERIANAL) 1 % EX OINT: 1.0000 "application " | TOPICAL_OINTMENT | CUTANEOUS | Status: DC | PRN

## 2019-07-05 MED ORDER — ONDANSETRON HCL 4 MG PO TABS: 4.0000 mg | ORAL_TABLET | ORAL | Status: DC | PRN

## 2019-07-05 MED ORDER — OXYCODONE-ACETAMINOPHEN 5-325 MG PO TABS
1.0000 | ORAL_TABLET | ORAL | Status: DC | PRN
  Administered 2019-07-05: 1 via ORAL
  Filled 2019-07-05: qty 1

## 2019-07-05 MED ORDER — BUTORPHANOL TARTRATE 1 MG/ML IJ SOLN
INTRAMUSCULAR | Status: AC
  Administered 2019-07-05: 2 mg via INTRAMUSCULAR
  Filled 2019-07-05: qty 2

## 2019-07-05 MED ORDER — ACETAMINOPHEN 325 MG PO TABS: 650.0000 mg | ORAL_TABLET | ORAL | Status: DC | PRN

## 2019-07-05 MED ORDER — PRENATAL MULTIVITAMIN CH: 1.0000 | ORAL_TABLET | Freq: Every day | ORAL | Status: DC

## 2019-07-05 MED ORDER — PANTOPRAZOLE SODIUM 40 MG PO TBEC
40.0000 mg | DELAYED_RELEASE_TABLET | Freq: Every day | ORAL | Status: DC
  Administered 2019-07-06: 40 mg via ORAL
  Filled 2019-07-05: qty 1

## 2019-07-05 MED ORDER — LACTATED RINGERS IV SOLN: 500.0000 mL | INTRAVENOUS | Status: DC | PRN

## 2019-07-05 MED ORDER — IBUPROFEN 600 MG PO TABS
600.0000 mg | ORAL_TABLET | Freq: Four times a day (QID) | ORAL | Status: DC
  Administered 2019-07-05 – 2019-07-06 (×4): 600 mg via ORAL
  Filled 2019-07-05 (×3): qty 1

## 2019-07-05 MED ORDER — LIDOCAINE HCL (PF) 1 % IJ SOLN
INTRAMUSCULAR | Status: AC
  Filled 2019-07-05: qty 30

## 2019-07-05 NOTE — OB Triage Note (Signed)
Recvd pt from ED. Pt c/o contractions that started around 1930 and are about 3-5 min apart. No LOF or vaginal bleeding. Pt had intercourse within the past 24 hours.

## 2019-07-05 NOTE — H&P (Signed)
Obstetric History and Physical  Stephanie May is a 23 y.o. G2P1001 with IUP at [redacted]w[redacted]d presenting with irregular contractions throughout the night, now stronger. Patient states she has been having  irregular, every 5-6 minutes contractions, minimal vaginal bleeding, intact membranes, with active fetal movement.    Prenatal Course Source of Care: [facility]  Pregnancy complications or risks:none  Prenatal labs and studies: ABO, Rh: --/--/PENDING (09/13 9528) Antibody: PENDING (09/13 0626) Rubella: 2.00 (02/13 1433) RPR: Non Reactive (06/02 1014)  HBsAg: Negative (02/13 1433)  HIV: Non Reactive (02/13 1433)  GBS:--/Negative (08/21 1142) 1 hr Glucola  normal Genetic screening normal Anatomy US normal  Past Medical History:  Diagnosis Date  . Chlamydia   . History of chlamydia infection 2 9 16   . Irregular menses   . Medical history non-contributory   . PCOS (polycystic ovarian syndrome)   . Pelvic pain in female   . Primary dysmenorrhea   . Sexually transmitted disease (STD)    previous h/o  . Unexplained weight gain     Past Surgical History:  Procedure Laterality Date  . NO PAST SURGERIES      OB History  Gravida Para Term Preterm AB Living  2 1 1     1   SAB TAB Ectopic Multiple Live Births        0 1    # Outcome Date GA Lbr Len/2nd Weight Sex Delivery Anes PTL Lv  2 Current           1 Term 10/31/17 [redacted]w[redacted]d 02:50 / 00:26 3030 g F Vag-Spont EPI  LIV    Social History   Socioeconomic History  . Marital status: Legally Separated    Spouse name: Not on file  . Number of children: 1  . Years of education: 35  . Highest education level: High school graduate  Occupational History  . Not on file  Social Needs  . Financial resource strain: Not very hard  . Food insecurity    Worry: Never true    Inability: Never true  . Transportation needs    Medical: No    Non-medical: No  Tobacco Use  . Smoking status: Never Smoker  . Smokeless tobacco: Never Used   Substance and Sexual Activity  . Alcohol use: No  . Drug use: No  . Sexual activity: Yes    Partners: Male    Birth control/protection: None  Lifestyle  . Physical activity    Days per week: 6 days    Minutes per session: 60 min  . Stress: To some extent  Relationships  . Social connections    Talks on phone: More than three times a week    Gets together: More than three times a week    Attends religious service: More than 4 times per year    Active member of club or organization: Not on file    Attends meetings of clubs or organizations: Not on file    Relationship status: Separated  Other Topics Concern  . Not on file  Social History Narrative   ** Merged History Encounter **        Family History  Problem Relation Age of Onset  . Diabetes Maternal Grandfather   . Lung cancer Maternal Uncle     Medications Prior to Admission  Medication Sig Dispense Refill Last Dose  . omeprazole (PRILOSEC) 20 MG capsule Take 20 mg by mouth daily.   07/04/2019 at Unknown time  . Prenatal Vit-Fe Fumarate-FA (PRENATAL MULTIVITAMIN) TABS  tablet Take 1 tablet by mouth daily at 12 noon.   Past Week at Unknown time    Allergies  Allergen Reactions  . Gluten Meal   . Lactose Intolerance (Gi)   . Macrobid [Nitrofurantoin Macrocrystal]   . Sulfa Antibiotics     Review of Systems: Negative except for what is mentioned in HPI.  Physical Exam: BP 119/73 (BP Location: Left Arm)   Pulse 81   Temp 98 F (36.7 C) (Oral)   Resp 16   Ht 5\' 5"  (1.651 m)   Wt 76.7 kg   LMP 09/02/2018   BMI 28.12 kg/m  GENERAL: Well-developed, well-nourished female in no acute distress.  LUNGS: Clear to auscultation bilaterally.  HEART: Regular rate and rhythm. ABDOMEN: Soft, nontender, nondistended, gravid. EXTREMITIES: Nontender, no edema, 2+ distal pulses. Cervical Exam: Dilation: 9 Effacement (%): 100 Station: -2 Presentation: Vertex Exam by:: [email or web address]  FHT:  Baseline rate 145 bpm   Variability  moderate  Accelerations present   Decelerations none Contractions: Every 4-6 mins, strong to palpation   Pertinent Labs/Studies:   Results for orders placed or performed during the hospital encounter of 07/05/19 (from the past 24 hour(s))  CBC     Status: Abnormal   Collection Time: 07/05/19  6:26 AM  Result Value Ref Range   WBC 9.0 4.0 - 10.5 K/uL   RBC 3.84 (L) 3.87 - 5.11 MIL/uL   Hemoglobin 11.1 (L) 12.0 - 15.0 g/dL   HCT 16.132.7 (L) 09.636.0 - 04.546.0 %   MCV 85.2 80.0 - 100.0 fL   MCH 28.9 26.0 - 34.0 pg   MCHC 33.9 30.0 - 36.0 g/dL   RDW 40.912.9 81.111.5 - 91.415.5 %   Platelets 149 (L) 150 - 400 K/uL   nRBC 0.0 0.0 - 0.2 %  Type and screen [facility]     Status: None (Preliminary result)   Collection Time: 07/05/19  6:26 AM  Result Value Ref Range   ABO/RH(D) PENDING    Antibody Screen PENDING    Sample Expiration      07/08/2019,2359 Performed at [facility], [address] [place], [place] [address]     Assessment : Stephanie May is a 23 y.o. G2P1001 at 1580w1d being admitted for labor.  Plan: Labor: Expectant management.  Induction/Augmentation as needed, per protocol FWB: Reassuring fetal heart tracing.  GBS negative Delivery plan: Hopeful for vaginal delivery  Ahtziry Saathoff, CNM [facility]

## 2019-07-06 ENCOUNTER — Ambulatory Visit: Payer: Self-pay

## 2019-07-06 LAB — CBC
HCT: 29.4 % — ABNORMAL LOW (ref 36.0–46.0)
Hemoglobin: 9.7 g/dL — ABNORMAL LOW (ref 12.0–15.0)
MCH: 28.4 pg (ref 26.0–34.0)
MCHC: 33 g/dL (ref 30.0–36.0)
MCV: 86.2 fL (ref 80.0–100.0)
Platelets: 163 10*3/uL (ref 150–400)
RBC: 3.41 MIL/uL — ABNORMAL LOW (ref 3.87–5.11)
RDW: 13 % (ref 11.5–15.5)
WBC: 6.8 10*3/uL (ref 4.0–10.5)
nRBC: 0 % (ref 0.0–0.2)

## 2019-07-06 MED ORDER — IBUPROFEN 600 MG PO TABS: 600.0000 mg | ORAL_TABLET | Freq: Four times a day (QID) | ORAL | 0 refills | Status: DC

## 2019-07-06 MED ORDER — FUSION PLUS PO CAPS: 1.0000 | ORAL_CAPSULE | Freq: Every day | ORAL | 1 refills | Status: DC

## 2019-07-06 NOTE — Progress Notes (Signed)
DC to home to car via hospital staff

## 2019-07-06 NOTE — Progress Notes (Signed)
DC instr reviewed with pt.  Verb u/o and f/u appointments.

## 2019-07-06 NOTE — Discharge Summary (Signed)
Physician Obstetric Discharge Summary  Patient ID: Stephanie May MRN: [id number] DOB/AGE: 13-Jul-1996 23 y.o.   Date of Admission: 07/05/2019  Date of Discharge:   Admitting Diagnosis: Onset of Labor at [redacted]w[redacted]d  Secondary Diagnosis: none  Mode of Delivery: normal spontaneous vaginal delivery     Discharge Diagnosis: normal vaginal delivery at term   Intrapartum Procedures: none   Post partum procedures: varicella vaccine at discharge  Complications: none   [place] is a [id number] who had a SVD on 07/05/2019;  for further details of this, please refer to the delivey note.  Patient had an uncomplicated postpartum course.  By time of discharge on PPD#1, her pain was controlled on oral pain medications; she had appropriate lochia and was ambulating, voiding without difficulty and tolerating regular diet.  She was deemed stable for discharge to home.       Labs: CBC Latest Ref Rng & Units 07/06/2019 07/05/2019 03/24/2019  WBC 4.0 - 10.5 K/uL 6.8 9.0 5.4  Hemoglobin 12.0 - 15.0 g/dL 9.7(L) 11.1(L) 12.1  Hematocrit 36.0 - 46.0 % 29.4(L) 32.7(L) 34.5  Platelets 150 - 400 K/uL 163 149(L) 157   O POS  Physical exam:  Blood pressure 99/60, pulse 60, temperature 97.9 F (36.6 C), temperature source Oral, resp. rate 18, height 5\' 5"  (1.651 m), weight 76.7 kg, last menstrual period 09/02/2018, SpO2 99 %, unknown if currently breastfeeding. General: alert and no distress Lochia: appropriate Abdomen: soft, NT Uterine Fundus: firm Extremities: No evidence of DVT seen on physical exam. No lower extremity edema.  Discharge Instructions: Per After Visit Summary. Activity: Advance as tolerated. Pelvic rest for 6 weeks.  Also refer to After Visit Summary Diet: Regular Medications:  Outpatient follow up:  Postpartum contraception: condoms, vasectomy  Discharged Condition: good  Discharged to: home   Newborn Data: Disposition:home with  mother  Apgars: APGAR (1 MIN): 8   APGAR (5 MINS): 9   APGAR (10 MINS):    Baby Feeding: Breast  Melody Rockney Ghee, CNM

## 2019-07-06 NOTE — Lactation Note (Signed)
This note was copied from a baby's chart. Lactation Consultation Note  Patient Name: Stephanie May [id number] Date: 07/06/2019 Reason for consult: Follow-up assessment;Term  LC has observed and assisted with good breast feeds yesterday and today.  Mom can easily hand express copious amounts of colostrum.  Breasts are fuller today with mature milk beginning to transition in.  [name] has been cluster feeding through the night and this am.  Praised mom for putting [name] to the breast whenever she demonstrates feeding cues.  She is latching deeply without assistance other than reminding mom to sandwich breast and adding pillow support.  Mom is experiencing strong tugs at the breast, with only a small amount of transient nipple discomfort.  Hand expressing milk after breast feeds and encouraging mom to rub on nipples to prevent bacteria, lubricate and for discomfort.  Coconut oil given and instructed in use which mom reports seems to be helping.  Mom breast fed first baby for 2 months, but felt milk started to decrease around that time.  Mom and baby to be discharged today.  Reviewed newborn stomach size, supply and demand, normal course of lactation and routine newborn feeding patterns.  Community lactation resources reviewed and contact numbers given encouraging to call with any questions, concerns or assistance. Maternal Data Formula Feeding for Exclusion: No Has patient been taught Hand Expression?: Yes(Can easily hand express lots of colostrum) Does the patient have breastfeeding experience prior to this delivery?: Yes  Feeding Feeding Type: Breast Fed  LATCH Score Latch: Grasps breast easily, tongue down, lips flanged, rhythmical sucking.  Audible Swallowing: Spontaneous and intermittent  Type of Nipple: Everted at rest and after stimulation  Comfort (Breast/Nipple): Filling, red/small blisters or bruises, mild/mod discomfort  Hold (Positioning): No assistance needed to  correctly position infant at breast.  LATCH Score: 9  Interventions Interventions: Breast massage;Hand express;Breast compression;Adjust position;Support pillows;Position options;Coconut oil  Lactation Tools Discussed/Used Tools: Coconut oil WIC Program: [employer] also)   Consult Status Consult Status: PRN    Jarold Motto 07/06/2019, 3:40 PM

## 2019-07-09 ENCOUNTER — Ambulatory Visit: Payer: BC Managed Care – PPO

## 2019-07-09 ENCOUNTER — Encounter: Payer: BC Managed Care – PPO | Admitting: Certified Nurse Midwife

## 2019-07-30 ENCOUNTER — Other Ambulatory Visit: Payer: Self-pay | Admitting: Obstetrics and Gynecology

## 2019-07-30 MED ORDER — SERTRALINE HCL 25 MG PO TABS: 25.0000 mg | ORAL_TABLET | Freq: Every day | ORAL | 6 refills | Status: DC

## 2019-08-20 ENCOUNTER — Other Ambulatory Visit: Payer: Self-pay

## 2019-08-20 ENCOUNTER — Ambulatory Visit (INDEPENDENT_AMBULATORY_CARE_PROVIDER_SITE_OTHER): Payer: BC Managed Care – PPO | Admitting: Obstetrics and Gynecology

## 2019-08-20 ENCOUNTER — Encounter: Payer: Self-pay | Admitting: Obstetrics and Gynecology

## 2019-08-20 DIAGNOSIS — Z6826 Body mass index (BMI) 26.0-26.9, adult: Secondary | ICD-10-CM

## 2019-08-20 DIAGNOSIS — O99345 Other mental disorders complicating the puerperium: Secondary | ICD-10-CM

## 2019-08-20 DIAGNOSIS — F53 Postpartum depression: Secondary | ICD-10-CM

## 2019-08-20 MED ORDER — SERTRALINE HCL 50 MG PO TABS: 50.0000 mg | ORAL_TABLET | Freq: Every day | ORAL | 6 refills | Status: DC

## 2019-08-20 NOTE — Patient Instructions (Signed)
Place postpartum visit patient instructions here.   Postpartum Baby Blues The postpartum period begins right after the birth of a baby. During this time, there is often a lot of joy and excitement. It is also a time of many changes in the life of the parents. No matter how many times a mother gives birth, each child brings new challenges to the family, including different ways of relating to one another. It is common to have feelings of excitement along with confusing changes in moods, emotions, and thoughts. You may feel happy one minute and sad or stressed the next. These feelings of sadness usually happen in the period right after you have your baby, and they go away within a week or two. This is called the "baby blues." What are the causes? There is no known cause of baby blues. It is likely caused by a combination of factors. However, changes in hormone levels after childbirth are believed to trigger some of the symptoms. Other factors that can play a role in these mood changes include:  Lack of sleep.  Stressful life events, such as poverty, caring for a loved one, or death of a loved one.  Genetics. What are the signs or symptoms? Symptoms of this condition include:  Brief changes in mood, such as going from extreme happiness to sadness.  Decreased concentration.  Difficulty sleeping.  Crying spells and tearfulness.  Loss of appetite.  Irritability.  Anxiety. If the symptoms of baby blues last for more than 2 weeks or become more severe, you may have postpartum depression. How is this diagnosed? This condition is diagnosed based on an evaluation of your symptoms. There are no medical or lab tests that lead to a diagnosis, but there are various questionnaires that a health care provider may use to identify women with the baby blues or postpartum depression. How is this treated? Treatment is not needed for this condition. The baby blues usually go away on their own in 1-2  weeks. Social support is often all that is needed. You will be encouraged to get adequate sleep and rest. Follow these instructions at home: Lifestyle      Get as much rest as you can. Take a nap when the baby sleeps.  Exercise regularly as told by your health care provider. Some women find yoga and walking to be helpful.  Eat a balanced and nourishing diet. This includes plenty of fruits and vegetables, whole grains, and lean proteins.  Do little things that you enjoy. Have a cup of tea, take a bubble bath, read your favorite magazine, or listen to your favorite music.  Avoid alcohol.  Ask for help with household chores, cooking, grocery shopping, or running errands. Do not try to do everything yourself. Consider hiring a postpartum doula to help. This is a professional who specializes in providing support to new mothers.  Try not to make any major life changes during pregnancy or right after giving birth. This can add stress. General instructions  Talk to people close to you about how you are feeling. Get support from your partner, family members, friends, or other new moms. You may want to join a support group.  Find ways to cope with stress. This may include: ? Writing your thoughts and feelings in a journal. ? Spending time outside. ? Spending time with people who make you laugh.  Try to stay positive in how you think. Think about the things you are grateful for.  Take over-the-counter and prescription medicines only  as told by your health care provider.  Let your health care provider know if you have any concerns.  Keep all postpartum visits as told by your health care provider. This is important. Contact a health care provider if:  Your baby blues do not go away after 2 weeks. Get help right away if:  You have thoughts of taking your own life (suicidal thoughts).  You think you may harm the baby or other people.  You see or hear things that are not there  (hallucinations). Summary  After giving birth, you may feel happy one minute and sad or stressed the next. Feelings of sadness that happen right after the baby is born and go away after a week or two are called the "baby blues."  You can manage the baby blues by getting enough rest, eating a healthy diet, exercising, spending time with supportive people, and finding ways to cope with stress.  If feelings of sadness and stress last longer than 2 weeks or get in the way of caring for your baby, talk to your health care provider. This may mean you have postpartum depression. This information is not intended to replace advice given to you by your health care provider. Make sure you discuss any questions you have with your health care provider. Document Released: 07/12/2004 Document Revised: 01/30/2019 Document Reviewed: 12/04/2016 Elsevier Patient Education  2020 [employer].

## 2019-08-20 NOTE — Progress Notes (Signed)
  Subjective:     Stephanie May is a 23 y.o. female who presents for a postpartum visit. She is 6 weeks postpartum following a spontaneous vaginal delivery. I have fully reviewed the prenatal and intrapartum course. The delivery was at 62 gestational weeks. Outcome: spontaneous vaginal delivery. Anesthesia: none. Postpartum course has been complicated by anxiety and depression. Baby's course has been complicated by colic. Baby is feeding by both breast and bottle - [employer] Good Start. Bleeding no bleeding. Bowel function is normal. Bladder function is normal. Patient is sexually active. Contraception method is condoms. Postpartum depression screening: positive. PPDS 18, has talked to therapist one time since delivery.   The following portions of the patient's history were reviewed and updated as appropriate: allergies, current medications, past family history, past medical history, past social history, past surgical history and problem list.  Review of Systems Pertinent items noted in HPI and remainder of comprehensive ROS otherwise negative.   Objective:    BP 116/70   Pulse 72   Ht 5\' 6"  (1.676 m)   Wt 146 lb 1.6 oz (66.3 kg)   LMP 09/02/2018   Breastfeeding Yes   BMI 23.58 kg/m   General:  alert, cooperative and appears stated age   Breasts:  inspection negative, no nipple discharge or bleeding, no masses or nodularity palpable  Lungs: clear to auscultation bilaterally  Heart:  regular rate and rhythm, S1, S2 normal, no murmur, click, rub or gallop  Abdomen: soft, non-tender; bowel sounds normal; no masses,  no organomegaly   Vulva:  not evaluated  Vagina: not evaluated  Cervix:  deferred  Corpus: not examined  Adnexa:  not evaluated  Rectal Exam: Not performed.        Assessment:     6 weeks postpartum exam. PPD Breast feeding PP anemia    Plan:    1. Contraception: condoms and vasectomy 2. Will increase zoloft to 50mg  daily and to follow up with therapist every  2 weeks for 3 visit and let me know how it is working in 3-4 weeks. Labs obtained will follow up accordingly  3. Follow up in: 3 months or as needed.

## 2019-08-20 NOTE — Progress Notes (Signed)
   PT is present today for her postpartum visit. Pt stated that she is breastfeeding and have had sexually intercourse recently and used a condom. Pt stated that her husbands is getting vasectomy EPDS= 18  Pt stated that she has along going on at home right now and started crying.

## 2019-08-21 LAB — CBC
Hematocrit: 38.1 % (ref 34.0–46.6)
Hemoglobin: 13 g/dL (ref 11.1–15.9)
MCH: 28.4 pg (ref 26.6–33.0)
MCHC: 34.1 g/dL (ref 31.5–35.7)
MCV: 83 fL (ref 79–97)
Platelets: 195 10*3/uL (ref 150–450)
RBC: 4.58 x10E6/uL (ref 3.77–5.28)
RDW: 13.2 % (ref 11.7–15.4)
WBC: 4.5 10*3/uL (ref 3.4–10.8)

## 2019-08-21 LAB — VITAMIN D 25 HYDROXY (VIT D DEFICIENCY, FRACTURES): Vit D, 25-Hydroxy: 27.4 ng/mL — ABNORMAL LOW (ref 30.0–100.0)

## 2019-08-21 LAB — B12 AND FOLATE PANEL
Folate: >20 ng/mL (ref >3.0)
Vitamin B-12: 778 pg/mL (ref 232–1245)

## 2019-08-21 LAB — FERRITIN: Ferritin: 44 ng/mL (ref 15–150)

## 2019-11-23 ENCOUNTER — Other Ambulatory Visit (HOSPITAL_COMMUNITY)
Admission: RE | Admit: 2019-11-23 | Discharge: 2019-11-23 | Disposition: A | Discharge status: Home / Self Care | Payer: Medicaid Other | Source: Ambulatory Visit | Attending: Certified Nurse Midwife | Admitting: Certified Nurse Midwife

## 2019-11-23 ENCOUNTER — Other Ambulatory Visit: Payer: Self-pay

## 2019-11-23 ENCOUNTER — Encounter: Payer: Self-pay | Admitting: Certified Nurse Midwife

## 2019-11-23 ENCOUNTER — Ambulatory Visit (INDEPENDENT_AMBULATORY_CARE_PROVIDER_SITE_OTHER): Payer: BC Managed Care – PPO | Admitting: Certified Nurse Midwife

## 2019-11-23 VITALS — BP 115/71 | HR 55 | Ht 66.0 in | Wt 140.2 lb

## 2019-11-23 DIAGNOSIS — Z01419 Encounter for gynecological examination (general) (routine) without abnormal findings: Secondary | ICD-10-CM | POA: Insufficient documentation

## 2019-11-23 DIAGNOSIS — Z113 Encounter for screening for infections with a predominantly sexual mode of transmission: Secondary | ICD-10-CM | POA: Diagnosis not present

## 2019-11-23 DIAGNOSIS — Z136 Encounter for screening for cardiovascular disorders: Secondary | ICD-10-CM | POA: Diagnosis not present

## 2019-11-23 MED ORDER — DOXYCYCLINE MONOHYDRATE 100 MG PO TABS: 100.0000 mg | ORAL_TABLET | Freq: Two times a day (BID) | ORAL | 0 refills | Status: AC

## 2019-11-23 NOTE — Progress Notes (Signed)
GYNECOLOGY ANNUAL PREVENTATIVE CARE ENCOUNTER NOTE  History:     Stephanie May is a 24 y.o. [phone] female here for a routine annual gynecologic exam.  Current complaints: none.   Denies abnormal vaginal bleeding, discharge, pelvic pain, problems with intercourse or other gynecologic concerns.     Social Married/heterosexual Lives with family Works ;Producer, television/film/video Exercise 3-4 x wkly for 1hr Denies smoking , drugs, occ. Alcohol  Gynecologic History Patient's last menstrual period was 11/15/2019 (exact date). Contraception: vasectomy Last Pap: 10/30/18. Results were: normal Last mammogram: n/a   Obstetric History OB History  Gravida Para Term Preterm AB Living  2 2 2     2   SAB TAB Ectopic Multiple Live Births        0 2    # Outcome Date GA Lbr Len/2nd Weight Sex Delivery Anes PTL Lv  2 Term 07/05/19 [redacted]w[redacted]d / 00:24 8 lb 1.5 oz (3.67 kg) F Vag-Spont None  LIV  1 Term 10/31/17 [redacted]w[redacted]d 02:50 / 00:26 6 lb 10.9 oz (3.03 kg) F Vag-Spont EPI  LIV    Past Medical History:  Diagnosis Date  . Chlamydia   . History of chlamydia infection 2 9 16   . Irregular menses   . Medical history non-contributory   . PCOS (polycystic ovarian syndrome)   . Pelvic pain in female   . Primary dysmenorrhea   . Sexually transmitted disease (STD)    previous h/o  . Unexplained weight gain     Past Surgical History:  Procedure Laterality Date  . NO PAST SURGERIES      Current Outpatient Medications on File Prior to Visit  Medication Sig Dispense Refill  . ibuprofen (ADVIL) 600 MG tablet Take 1 tablet (600 mg total) by mouth every 6 (six) hours. 30 tablet 0  . sertraline (ZOLOFT) 50 MG tablet Take 1 tablet (50 mg total) by mouth daily. 30 tablet 6  . Iron-FA-B Cmp-C-Biot-Probiotic (FUSION PLUS) CAPS Take 1 capsule by mouth daily. (Patient not taking: Reported on 11/23/2019) 60 capsule 1  . Prenatal Vit-Fe Fumarate-FA (PRENATAL MULTIVITAMIN) TABS tablet Take 1 tablet by mouth daily at 12  noon.     No current facility-administered medications on file prior to visit.    Allergies  Allergen Reactions  . Gluten Meal   . Lactose Intolerance (Gi)   . Macrobid [Nitrofurantoin Macrocrystal]   . Sulfa Antibiotics     Social History:  reports that she has never smoked. She has never used smokeless tobacco. She reports that she does not drink alcohol or use drugs.  Family History  Problem Relation Age of Onset  . Diabetes Maternal Grandfather   . Lung cancer Maternal Uncle     The following portions of the patient's history were reviewed and updated as appropriate: allergies, current medications, past family history, past medical history, past social history, past surgical history and problem list.  Review of Systems Pertinent items noted in HPI and remainder of comprehensive ROS otherwise negative.  Physical Exam:  BP 115/71   Pulse (!) 55   Ht 5\' 6"  (1.676 m)   Wt 140 lb 3 oz (63.6 kg)   LMP 11/15/2019 (Exact Date)   Breastfeeding No   BMI 22.63 kg/m  CONSTITUTIONAL: Well-developed, well-nourished female in no acute distress.  HENT:  Normocephalic, atraumatic, External right and left ear normal. Oropharynx is clear and moist EYES: Conjunctivae and EOM are normal. Pupils are equal, round, and reactive to light. No scleral icterus.  NECK:  Normal range of motion, supple, no masses.  Normal thyroid.  SKIN: Skin is warm and dry. No rash noted. Not diaphoretic. No erythema. No pallor. MUSCULOSKELETAL: Normal range of motion. No tenderness.  No cyanosis, clubbing, or edema.  2+ distal pulses. NEUROLOGIC: Alert and oriented to person, place, and time. Normal reflexes, muscle tone coordination.  PSYCHIATRIC: Normal mood and affect. Normal behavior. Normal judgment and thought content. CARDIOVASCULAR: Normal heart rate noted, regular rhythm RESPIRATORY: Clear to auscultation bilaterally. Effort and breath sounds normal, no problems with respiration noted. BREASTS: Symmetric  in size. No masses, tenderness, skin changes, nipple drainage, or lymphadenopathy bilaterally. ABDOMEN: Soft, no distention noted.  No tenderness, rebound or guarding.  PELVIC: Normal appearing external genitalia and urethral meatus; normal appearing vaginal mucosa and cervix.  No abnormal discharge noted.  Pap smear not indicated   Normal uterine size, no other palpable masses, no uterine or adnexal tenderness.   Assessment and Plan:    1. Women's annual routine gynecological examination  Pap not indicated Mammogram not indicated Labs:lipid Refills:none  Asked about decrease sex drive and use of zolft- discussed side effect. Offered to decrease dose. She declines  Has boil on inner left  Thigh, not at a head that is able to be lanced. Orders placed for doxycycline .  Routine preventative health maintenance measures emphasized. Please refer to After Visit Summary for other counseling recommendations.      Philip Aspen, CNM

## 2019-11-23 NOTE — Patient Instructions (Signed)
Preventive Care 21-23 Years Old, Female Preventive care refers to visits with your health care provider and lifestyle choices that can promote health and wellness. This includes:  A yearly physical exam. This may also be called an annual well check.  Regular dental visits and eye exams.  Immunizations.  Screening for certain conditions.  Healthy lifestyle choices, such as eating a healthy diet, getting regular exercise, not using drugs or products that contain nicotine and tobacco, and limiting alcohol use. What can I expect for my preventive care visit? Physical exam Your health care provider will check your:  Height and weight. This may be used to calculate body mass index (BMI), which tells if you are at a healthy weight.  Heart rate and blood pressure.  Skin for abnormal spots. Counseling Your health care provider may ask you questions about your:  Alcohol, tobacco, and drug use.  Emotional well-being.  Home and relationship well-being.  Sexual activity.  Eating habits.  Work and work environment.  Method of birth control.  Menstrual cycle.  Pregnancy history. What immunizations do I need?  Influenza (flu) vaccine  This is recommended every year. Tetanus, diphtheria, and pertussis (Tdap) vaccine  You may need a Td booster every 10 years. Varicella (chickenpox) vaccine  You may need this if you have not been vaccinated. Human papillomavirus (HPV) vaccine  If recommended by your health care provider, you may need three doses over 6 months. Measles, mumps, and rubella (MMR) vaccine  You may need at least one dose of MMR. You may also need a second dose. Meningococcal conjugate (MenACWY) vaccine  One dose is recommended if you are age 19-21 years and a first-year college student living in a residence hall, or if you have one of several medical conditions. You may also need additional booster doses. Pneumococcal conjugate (PCV13) vaccine  You may need  this if you have certain conditions and were not previously vaccinated. Pneumococcal polysaccharide (PPSV23) vaccine  You may need one or two doses if you smoke cigarettes or if you have certain conditions. Hepatitis A vaccine  You may need this if you have certain conditions or if you travel or work in places where you may be exposed to hepatitis A. Hepatitis B vaccine  You may need this if you have certain conditions or if you travel or work in places where you may be exposed to hepatitis B. Haemophilus influenzae type b (Hib) vaccine  You may need this if you have certain conditions. You may receive vaccines as individual doses or as more than one vaccine together in one shot (combination vaccines). Talk with your health care provider about the risks and benefits of combination vaccines. What tests do I need?  Blood tests  Lipid and cholesterol levels. These may be checked every 5 years starting at age 20.  Hepatitis C test.  Hepatitis B test. Screening  Diabetes screening. This is done by checking your blood sugar (glucose) after you have not eaten for a while (fasting).  Sexually transmitted disease (STD) testing.  BRCA-related cancer screening. This may be done if you have a family history of breast, ovarian, tubal, or peritoneal cancers.  Pelvic exam and Pap test. This may be done every 3 years starting at age 21. Starting at age 30, this may be done every 5 years if you have a Pap test in combination with an HPV test. Talk with your health care provider about your test results, treatment options, and if necessary, the need for more tests.   Follow these instructions at home: Eating and drinking   Eat a diet that includes fresh fruits and vegetables, whole grains, lean protein, and low-fat dairy.  Take vitamin and mineral supplements as recommended by your health care provider.  Do not drink alcohol if: ? Your health care provider tells you not to drink. ? You are  pregnant, may be pregnant, or are planning to become pregnant.  If you drink alcohol: ? Limit how much you have to 0-1 drink a day. ? Be aware of how much alcohol is in your drink. In the [place] one drink equals one 12 oz bottle of beer (355 mL), one 5 oz glass of wine (148 mL), or one 1 oz glass of hard liquor (44 mL). Lifestyle  Take daily care of your teeth and gums.  Stay active. Exercise for at least 30 minutes on 5 or more days each week.  Do not use any products that contain nicotine or tobacco, such as cigarettes, e-cigarettes, and chewing tobacco. If you need help quitting, ask your health care provider.  If you are sexually active, practice safe sex. Use a condom or other form of birth control (contraception) in order to prevent pregnancy and STIs (sexually transmitted infections). If you plan to become pregnant, see your health care provider for a preconception visit. What's next?  Visit your health care provider once a year for a well check visit.  Ask your health care provider how often you should have your eyes and teeth checked.  Stay up to date on all vaccines. This information is not intended to replace advice given to you by your health care provider. Make sure you discuss any questions you have with your health care provider. Document Revised: 06/19/2018 Document Reviewed: 06/19/2018 Elsevier Patient Education  2020 [employer].

## 2019-11-24 LAB — LIPID PANEL
Chol/HDL Ratio: 2 ratio (ref 0.0–4.4)
Cholesterol, Total: 138 mg/dL (ref 100–199)
HDL: 68 mg/dL (ref >39)
LDL Chol Calc (NIH): 60 mg/dL (ref 0–99)
Triglycerides: 39 mg/dL (ref 0–149)
VLDL Cholesterol Cal: 10 mg/dL (ref 5–40)

## 2019-11-24 LAB — CERVICOVAGINAL ANCILLARY ONLY
Chlamydia: NEGATIVE
Comment: NEGATIVE
Comment: NORMAL
Neisseria Gonorrhea: NEGATIVE

## 2020-04-04 ENCOUNTER — Other Ambulatory Visit: Payer: Self-pay | Admitting: Certified Nurse Midwife

## 2020-04-04 MED ORDER — SERTRALINE HCL 100 MG PO TABS: 100.0000 mg | ORAL_TABLET | Freq: Every day | ORAL | 1 refills | Status: DC

## 2020-04-04 NOTE — Progress Notes (Signed)
zoloft refill ordered. Dose changed , pt states when she last saw Melody she was told she could increase to 100 mg daily. She saw me and was talking about stopping the medication but did not and then started to increase dose to 100 mg daily . Request refill. Orders placed. Pt informed to follow up with Medication check up and phq 9 .   Doreene Burke, CNM

## 2020-05-16 ENCOUNTER — Other Ambulatory Visit (HOSPITAL_COMMUNITY)
Admission: RE | Admit: 2020-05-16 | Discharge: 2020-05-16 | Disposition: A | Discharge status: Home / Self Care | Payer: BC Managed Care – PPO | Source: Ambulatory Visit | Attending: Certified Nurse Midwife | Admitting: Certified Nurse Midwife

## 2020-05-16 ENCOUNTER — Encounter: Payer: Self-pay | Admitting: Certified Nurse Midwife

## 2020-05-16 ENCOUNTER — Ambulatory Visit (INDEPENDENT_AMBULATORY_CARE_PROVIDER_SITE_OTHER): Payer: Commercial Managed Care - PPO | Admitting: Certified Nurse Midwife

## 2020-05-16 VITALS — BP 103/69 | HR 50 | Ht 66.0 in | Wt 138.0 lb

## 2020-05-16 DIAGNOSIS — Z113 Encounter for screening for infections with a predominantly sexual mode of transmission: Secondary | ICD-10-CM | POA: Diagnosis present

## 2020-05-16 DIAGNOSIS — R350 Frequency of micturition: Secondary | ICD-10-CM

## 2020-05-16 DIAGNOSIS — Z3009 Encounter for other general counseling and advice on contraception: Secondary | ICD-10-CM

## 2020-05-16 LAB — POCT URINE PREGNANCY: Preg Test, Ur: NEGATIVE

## 2020-05-16 MED ORDER — NORGESTIM-ETH ESTRAD TRIPHASIC 0.18/0.215/0.25 MG-25 MCG PO TABS: 1.0000 | ORAL_TABLET | Freq: Every day | ORAL | 11 refills | Status: DC

## 2020-05-16 NOTE — Progress Notes (Signed)
Subjective:    Stephanie May is a 24 y.o. female who presents for contraception counseling. The patient has no complaints today. The patient is sexually active. Pertinent past medical history: none. She also complains of urinary frequency and odor. She is requesting STD testing. State she had a new partner x one , she is getting back together with her husband and wants to make sure she is clean.   Menstrual History: OB History    Gravida  2   Para  2   Term  2   Preterm      AB      Living  2     SAB      TAB      Ectopic      Multiple  0   Live Births  2            Patient's last menstrual period was 04/18/2020 (exact date).    The following portions of the patient's history were reviewed and updated as appropriate: allergies, current medications, past family history, past medical history, past social history, past surgical history and problem list.  Review of Systems Pertinent items are noted in HPI.   Objective:    Pelvic: cervix normal in appearance, external genitalia normal and blood present, pt on her peroid      Office Visit from 05/16/2020 in [facility]  PHQ-9 Total Score 16      Assessment:    24 y.o., starting OCP (estrogen/progesterone), no contraindications.   Plan:    All questions answered.  Pt states she can not remember when she had her last pap smear and inquired about timing and results. Results reviewed  Discussed when she is due for next pap. Discussed zoloft use, pt seen in June for refill .Pt states she has been taking 50 mg daily with good results. Will follow up with labs: Std testing , urine culture. Orders placed for OCP. Pt state she took ortho tri Lo in the past and it worked well. She has no questions. Follow up PRN.   Doreene Burke, CNM

## 2020-05-16 NOTE — Patient Instructions (Signed)
Oral Contraception Use Oral contraceptive pills (OCPs) are medicines that you take to prevent pregnancy. OCPs work by:  Preventing the ovaries from releasing eggs.  Thickening mucus in the lower part of the uterus (cervix), which prevents sperm from entering the uterus.  Thinning the lining of the uterus (endometrium), which prevents a fertilized egg from attaching to the endometrium. OCPs are highly effective when taken exactly as prescribed. However, OCPs do not prevent sexually transmitted infections (STIs). Safe sex practices, such as using condoms while on an OCP, can help prevent STIs. Before taking OCPs, you may have a physical exam, blood test, and Pap test. A Pap test involves taking a sample of cells from your cervix to check for cancer. Discuss with your health care provider the possible side effects of the OCP you may be prescribed. When you start an OCP, be aware that it can take 2-3 months for your body to adjust to changes in hormone levels. How to take oral contraceptive pills Follow instructions from your health care provider about how to start taking your first cycle of OCPs. Your health care provider may recommend that you:  Start the pill on day 1 of your menstrual period. If you start at this time, you will not need any backup form of birth control (contraception), such as condoms.  Start the pill on the first Sunday after your menstrual period or on the day you get your prescription. In these cases, you will need to use backup contraception for the first week.  Start the pill at any time of your cycle. ? If you take the pill within 5 days of the start of your period, you will not need a backup form of contraception. ? If you start at any other time of your menstrual cycle, you will need to use another form of contraception for 7 days. If your OCP is the type called a minipill, it will protect you from pregnancy after taking it for 2 days (48 hours), and you can stop using  backup contraception after that time. After you have started taking OCPs:  If you forget to take 1 pill, take it as soon as you remember. Take the next pill at the regular time.  If you miss 2 or more pills, call your health care provider. Different pills have different instructions for missed doses. Use backup birth control until your next menstrual period starts.  If you use a 28-day pack that contains inactive pills and you miss 1 of the last 7 pills (pills with no hormones), throw away the rest of the non-hormone pills and start a new pill pack. No matter which day you start the OCP, you will always start a new pack on that same day of the week. Have an extra pack of OCPs and a backup contraceptive method available in case you miss some pills or lose your OCP pack. Follow these instructions at home:  Do not use any products that contain nicotine or tobacco, such as cigarettes and e-cigarettes. If you need help quitting, ask your health care provider.  Always use a condom to protect against STIs. OCPs do not protect against STIs.  Use a calendar to mark the days of your menstrual period.  Read the information and directions that came with your OCP. Talk to your health care provider if you have questions. Contact a health care provider if:  You develop nausea and vomiting.  You have abnormal vaginal discharge or bleeding.  You develop a rash.    You miss your menstrual period. Depending on the type of OCP you are taking, this may be a sign of pregnancy. Ask your health care provider for more information.  You are losing your hair.  You need treatment for mood swings or depression.  You get dizzy when taking the OCP.  You develop acne after taking the OCP.  You become pregnant or think you may be pregnant.  You have diarrhea, constipation, and abdominal pain or cramps.  You miss 2 or more pills. Get help right away if:  You develop chest pain.  You develop shortness of  breath.  You have an uncontrolled or severe headache.  You develop numbness or slurred speech.  You develop visual or speech problems.  You develop pain, redness, and swelling in your legs.  You develop weakness or numbness in your arms or legs. Summary  Oral contraceptive pills (OCPs) are medicines that you take to prevent pregnancy.  OCPs do not prevent sexually transmitted infections (STIs). Always use a condom to protect against STIs.  When you start an OCP, be aware that it can take 2-3 months for your body to adjust to changes in hormone levels.  Read all the information and directions that come with your OCP. This information is not intended to replace advice given to you by your health care provider. Make sure you discuss any questions you have with your health care provider. Document Revised: 01/30/2019 Document Reviewed: 11/19/2016 Elsevier Patient Education  2020 [employer] Inc[employer]  

## 2020-05-17 ENCOUNTER — Other Ambulatory Visit: Payer: Self-pay | Admitting: Certified Nurse Midwife

## 2020-05-17 LAB — CERVICOVAGINAL ANCILLARY ONLY
Bacterial Vaginitis (gardnerella): NEGATIVE
Candida Glabrata: NEGATIVE
Candida Vaginitis: POSITIVE — AB
Chlamydia: POSITIVE — AB
Comment: NEGATIVE
Comment: NEGATIVE
Comment: NEGATIVE
Comment: NEGATIVE
Comment: NEGATIVE
Comment: NORMAL
Neisseria Gonorrhea: NEGATIVE
Trichomonas: NEGATIVE

## 2020-05-17 LAB — RPR: RPR Ser Ql: NONREACTIVE

## 2020-05-17 LAB — HIV ANTIBODY (ROUTINE TESTING W REFLEX): HIV Screen 4th Generation wRfx: NONREACTIVE

## 2020-05-17 LAB — HEPATITIS B SURFACE ANTIGEN: Hepatitis B Surface Ag: NEGATIVE

## 2020-05-17 MED ORDER — AZITHROMYCIN 500 MG PO TABS: 1000.0000 mg | ORAL_TABLET | Freq: Once | ORAL | 0 refills | Status: AC

## 2020-05-17 MED ORDER — FLUCONAZOLE 150 MG PO TABS: 150.0000 mg | ORAL_TABLET | Freq: Once | ORAL | 0 refills | Status: AC

## 2020-05-17 NOTE — Progress Notes (Signed)
Swab positive for yeast and chlamydia orders placed for treatment.   Doreene Burke, CNM

## 2020-05-18 LAB — URINE CULTURE: Organism ID, Bacteria: NO GROWTH

## 2020-05-18 LAB — SPECIMEN STATUS REPORT

## 2020-05-18 LAB — HSV(HERPES SIMPLEX VRS) I + II AB-IGG
HSV 1 Glycoprotein G Ab, IgG: <0.91 index (ref 0.00–0.90)
HSV 2 IgG, Type Spec: <0.91 index (ref 0.00–0.90)

## 2020-05-20 ENCOUNTER — Other Ambulatory Visit: Payer: Self-pay | Admitting: Certified Nurse Midwife

## 2020-05-20 MED ORDER — AZITHROMYCIN 500 MG PO TABS: 1000.0000 mg | ORAL_TABLET | Freq: Once | ORAL | 0 refills | Status: AC

## 2020-05-20 NOTE — Progress Notes (Signed)
Pt request treatment for her partner for Chlamydia. Orders placed.   Doreene Burke, CNM

## 2020-05-25 ENCOUNTER — Other Ambulatory Visit: Payer: Self-pay

## 2020-05-25 NOTE — Telephone Encounter (Signed)
mychart message sent to patient

## 2020-08-15 ENCOUNTER — Other Ambulatory Visit: Payer: Self-pay

## 2020-08-15 ENCOUNTER — Other Ambulatory Visit (INDEPENDENT_AMBULATORY_CARE_PROVIDER_SITE_OTHER): Payer: Commercial Managed Care - PPO

## 2020-08-15 DIAGNOSIS — A749 Chlamydial infection, unspecified: Secondary | ICD-10-CM | POA: Diagnosis not present

## 2020-08-15 LAB — POCT URINALYSIS DIPSTICK
Bilirubin, UA: NEGATIVE
Blood, UA: NEGATIVE
Glucose, UA: NEGATIVE
Ketones, UA: NEGATIVE
Leukocytes, UA: NEGATIVE
Nitrite, UA: NEGATIVE
Protein, UA: NEGATIVE
Spec Grav, UA: 1.015 (ref 1.010–1.025)
Urobilinogen, UA: 0.2 E.U./dL
pH, UA: 5 (ref 5.0–8.0)

## 2020-08-17 ENCOUNTER — Other Ambulatory Visit: Payer: Self-pay | Admitting: Certified Nurse Midwife

## 2020-08-17 LAB — URINE CULTURE

## 2020-08-17 LAB — GC/CHLAMYDIA PROBE AMP
Chlamydia trachomatis, NAA: POSITIVE — AB
Neisseria Gonorrhoeae by PCR: NEGATIVE

## 2020-08-17 MED ORDER — AZITHROMYCIN 500 MG PO TABS: 1000.0000 mg | ORAL_TABLET | Freq: Once | ORAL | 1 refills | Status: AC

## 2020-08-17 NOTE — Progress Notes (Signed)
Pt positive for chlamydia , treatment ordered. PT notified in my chart.   Doreene Burke, CNM

## 2020-08-24 ENCOUNTER — Ambulatory Visit (INDEPENDENT_AMBULATORY_CARE_PROVIDER_SITE_OTHER): Payer: Commercial Managed Care - PPO | Admitting: Certified Nurse Midwife

## 2020-08-24 ENCOUNTER — Other Ambulatory Visit: Payer: Self-pay

## 2020-08-24 ENCOUNTER — Other Ambulatory Visit (HOSPITAL_COMMUNITY)
Admission: RE | Admit: 2020-08-24 | Discharge: 2020-08-24 | Disposition: A | Discharge status: Home / Self Care | Payer: BC Managed Care – PPO | Source: Ambulatory Visit | Attending: Certified Nurse Midwife | Admitting: Certified Nurse Midwife

## 2020-08-24 ENCOUNTER — Encounter: Payer: Self-pay | Admitting: Certified Nurse Midwife

## 2020-08-24 VITALS — BP 109/66 | HR 63 | Ht 66.0 in | Wt 140.1 lb

## 2020-08-24 DIAGNOSIS — Z8619 Personal history of other infectious and parasitic diseases: Secondary | ICD-10-CM

## 2020-08-24 NOTE — Progress Notes (Signed)
GYN ENCOUNTER NOTE  Subjective:       Stephanie May is a 24 y.o. [phone] female is here for gynecologic evaluation of the following issues:  1. TOC , pt tested positive for chlamydia in July was treated and tested positive again this month. She believes she did not wait long enough when taking medications in July before have intercourse again with her partner. She presents today for TOC. She state she was having UTI like symptoms up to a few days ago they resolved. She would like to be tested today to make sure it has cleared before being intimate with her partner .     Gynecologic History Patient's last menstrual period was 08/24/2020 (exact date). Contraception: vasectomy Last Pap: 10/30/18. Results were: normal Last mammogram: n/a   Obstetric History OB History  Gravida Para Term Preterm AB Living  2 2 2     2   SAB TAB Ectopic Multiple Live Births        0 2    # Outcome Date GA Lbr Len/2nd Weight Sex Delivery Anes PTL Lv  2 Term 07/05/19 [redacted]w[redacted]d / 00:24 8 lb 1.5 oz (3.67 kg) F Vag-Spont None  LIV  1 Term 10/31/17 [redacted]w[redacted]d 02:50 / 00:26 6 lb 10.9 oz (3.03 kg) F Vag-Spont EPI  LIV    Past Medical History:  Diagnosis Date  . Chlamydia   . History of chlamydia infection 2 9 16   . Irregular menses   . Medical history non-contributory   . PCOS (polycystic ovarian syndrome)   . Pelvic pain in female   . Primary dysmenorrhea   . Sexually transmitted disease (STD)    previous h/o  . Unexplained weight gain     Past Surgical History:  Procedure Laterality Date  . NO PAST SURGERIES      Current Outpatient Medications on File Prior to Visit  Medication Sig Dispense Refill  . Norgestimate-Ethinyl Estradiol Triphasic (ORTHO TRI-CYCLEN LO) 0.18/0.215/0.25 MG-25 MCG tab Take 1 tablet by mouth daily. 28 tablet 11  . sertraline (ZOLOFT) 100 MG tablet Take 1 tablet (100 mg total) by mouth daily. 30 tablet 1   No current facility-administered medications on file prior to visit.     Allergies  Allergen Reactions  . Gluten Meal   . Lactose Intolerance (Gi)   . Macrobid [Nitrofurantoin Macrocrystal]   . Sulfa Antibiotics     Social History   Socioeconomic History  . Marital status: Legally Separated    Spouse name: Not on file  . Number of children: 1  . Years of education: 23  . Highest education level: High school graduate  Occupational History  . Not on file  Tobacco Use  . Smoking status: Never Smoker  . Smokeless tobacco: Never Used  Vaping Use  . Vaping Use: Never used  Substance and Sexual Activity  . Alcohol use: No  . Drug use: No  . Sexual activity: Yes    Partners: Male    Birth control/protection: Condom  Other Topics Concern  . Not on file  Social History Narrative   ** Merged History Encounter **       Social Determinants of Health   Financial Resource Strain:   . Difficulty of Paying Living Expenses: Not on file  Food Insecurity:   . Worried About 11-[birth year]1981 in the Last Year: Not on file  . Ran Out of Food in the Last Year: Not on file  Transportation Needs:   . Lack of Transportation (  Medical): Not on file  . Lack of Transportation (Non-Medical): Not on file  Physical Activity:   . Days of Exercise per Week: Not on file  . Minutes of Exercise per Session: Not on file  Stress:   . Feeling of Stress : Not on file  Social Connections:   . Frequency of Communication with Friends and Family: Not on file  . Frequency of Social Gatherings with Friends and Family: Not on file  . Attends Religious Services: Not on file  . Active Member of Clubs or Organizations: Not on file  . Attends Banker Meetings: Not on file  . Marital Status: Not on file  Intimate Partner Violence:   . Fear of Current or Ex-Partner: Not on file  . Emotionally Abused: Not on file  . Physically Abused: Not on file  . Sexually Abused: Not on file    Family History  Problem Relation Age of Onset  . Diabetes Maternal Grandfather    . Lung cancer Maternal Uncle     The following portions of the patient's history were reviewed and updated as appropriate: allergies, current medications, past family history, past medical history, past social history, past surgical history and problem list.  Review of Systems Review of Systems - Negative except as mentioned in HPI Review of Systems - General ROS: negative for - chills, fatigue, fever, hot flashes, malaise or night sweats Hematological and Lymphatic ROS: negative for - bleeding problems or swollen lymph nodes Gastrointestinal ROS: negative for - abdominal pain, blood in stools, change in bowel habits and nausea/vomiting Musculoskeletal ROS: negative for - joint pain, muscle pain or muscular weakness Genito-Urinary ROS: negative for - change in menstrual cycle, dysmenorrhea, dyspareunia, dysuria, genital discharge, genital ulcers, hematuria, incontinence, irregular/heavy menses, nocturia or pelvic pain.  Objective:   BP 109/66   Pulse 63   Ht 5\' 6"  (1.676 m)   Wt 140 lb 1 oz (63.5 kg)   LMP 08/24/2020 (Exact Date)   BMI 22.61 kg/m  CONSTITUTIONAL: Well-developed, well-nourished female in no acute distress.  HENT:  Normocephalic, atraumatic.  NECK: Normal range of motion, supple, no masses.  Normal thyroid.  SKIN: Skin is warm and dry. No rash noted. Not diaphoretic. No erythema. No pallor. NEUROLGIC: Alert and oriented to person, place, and time. PSYCHIATRIC: Normal mood and affect. Normal behavior. Normal judgment and thought content. CARDIOVASCULAR:Not Examined RESPIRATORY: Not Examined BREASTS: Not Examined ABDOMEN: Soft, non distended; Non tender.  No Organomegaly. PELVIC:  External Genitalia: Normal  BUS: Normal  Vagina: Normal  Cervix: Normal, pt on her period, blood present  MUSCULOSKELETAL: Normal range of motion. No tenderness.  No cyanosis, clubbing, or edema.     Assessment:   1. History of chlamydia - Cervicovaginal ancillary only      Plan:   Will follow up with results of swab. Discussed using alternative treatment if infection still present. She verbalizes and agree.   13/12/2019, CNM

## 2020-08-26 ENCOUNTER — Telehealth: Payer: Self-pay

## 2020-08-26 LAB — CERVICOVAGINAL ANCILLARY ONLY
Chlamydia: POSITIVE — AB
Comment: NEGATIVE
Comment: NEGATIVE
Comment: NORMAL
Neisseria Gonorrhea: NEGATIVE
Trichomonas: NEGATIVE

## 2020-08-26 NOTE — Telephone Encounter (Signed)
Pt  Tested + for chlamydia on 10/27. AT gave ATB with 1 refill for her partner. Pt and partner have completed ATB and no IC since treatment.   Pt saw AT on 11/3 for another swab due to sx. Test was + for chlamydia.   Advised pt that we normally do not test again for 4 weeks.  TOC appt made for 12/1.  Advised pt to use condoms or abstain from IC for another 7 days.   Pt voiced understanding.

## 2020-08-29 ENCOUNTER — Other Ambulatory Visit: Payer: Self-pay | Admitting: Certified Nurse Midwife

## 2020-08-29 MED ORDER — DOXYCYCLINE MONOHYDRATE 100 MG PO TABS: 100.0000 mg | ORAL_TABLET | Freq: Two times a day (BID) | ORAL | 0 refills | Status: AC

## 2020-08-31 ENCOUNTER — Telehealth: Payer: Self-pay

## 2020-08-31 NOTE — Telephone Encounter (Signed)
mychart message sent to patient regarding self swab.

## 2020-09-21 ENCOUNTER — Encounter: Payer: Self-pay | Admitting: Certified Nurse Midwife

## 2020-09-21 ENCOUNTER — Ambulatory Visit (INDEPENDENT_AMBULATORY_CARE_PROVIDER_SITE_OTHER): Payer: Commercial Managed Care - PPO | Admitting: Certified Nurse Midwife

## 2020-09-21 ENCOUNTER — Other Ambulatory Visit: Payer: Self-pay

## 2020-09-21 VITALS — BP 113/64 | HR 58 | Ht 66.0 in | Wt 146.5 lb

## 2020-09-21 DIAGNOSIS — Z113 Encounter for screening for infections with a predominantly sexual mode of transmission: Secondary | ICD-10-CM

## 2020-09-21 DIAGNOSIS — Z8619 Personal history of other infectious and parasitic diseases: Secondary | ICD-10-CM

## 2020-09-21 NOTE — Progress Notes (Signed)
GYN ENCOUNTER NOTE  Subjective:       Stephanie May is a 24 y.o. [phone] female is here for gynecologic evaluation of the following issues:  1. Follow for test of cure , was positive for Chlamydia. She denies any symptoms states they have all resolved and that she just wants to make sure.    Gynecologic History Patient's last menstrual period was 08/24/2020 (exact date). Contraception: vasectomy Last Pap: 10/30/18 Results were: normal Last mammogram: n/a   Obstetric History OB History  Gravida Para Term Preterm AB Living  2 2 2     2   SAB TAB Ectopic Multiple Live Births        0 2    # Outcome Date GA Lbr Len/2nd Weight Sex Delivery Anes PTL Lv  2 Term 07/05/19 [redacted]w[redacted]d / 00:24 8 lb 1.5 oz (3.67 kg) F Vag-Spont None  LIV  1 Term 10/31/17 [redacted]w[redacted]d 02:50 / 00:26 6 lb 10.9 oz (3.03 kg) F Vag-Spont EPI  LIV    Past Medical History:  Diagnosis Date  . Chlamydia   . History of chlamydia infection 2 9 16   . Irregular menses   . Medical history non-contributory   . PCOS (polycystic ovarian syndrome)   . Pelvic pain in female   . Primary dysmenorrhea   . Sexually transmitted disease (STD)    previous h/o  . Unexplained weight gain     Past Surgical History:  Procedure Laterality Date  . NO PAST SURGERIES      Current Outpatient Medications on File Prior to Visit  Medication Sig Dispense Refill  . Norgestimate-Ethinyl Estradiol Triphasic (ORTHO TRI-CYCLEN LO) 0.18/0.215/0.25 MG-25 MCG tab Take 1 tablet by mouth daily. 28 tablet 11  . sertraline (ZOLOFT) 100 MG tablet Take 1 tablet (100 mg total) by mouth daily. 30 tablet 1   No current facility-administered medications on file prior to visit.    Allergies  Allergen Reactions  . Gluten Meal   . Lactose Intolerance (Gi)   . Macrobid [Nitrofurantoin Macrocrystal]   . Sulfa Antibiotics     Social History   Socioeconomic History  . Marital status: Legally Separated    Spouse name: Not on file  . Number of children: 1   . Years of education: 61  . Highest education level: High school graduate  Occupational History  . Not on file  Tobacco Use  . Smoking status: Never Smoker  . Smokeless tobacco: Never Used  Vaping Use  . Vaping Use: Never used  Substance and Sexual Activity  . Alcohol use: No  . Drug use: No  . Sexual activity: Yes    Partners: Male    Birth control/protection: Condom  Other Topics Concern  . Not on file  Social History Narrative   ** Merged History Encounter **       Social Determinants of Health   Financial Resource Strain:   . Difficulty of Paying Living Expenses: Not on file  Food Insecurity:   . Worried About 11-19-1980 in the Last Year: Not on file  . Ran Out of Food in the Last Year: Not on file  Transportation Needs:   . Lack of Transportation (Medical): Not on file  . Lack of Transportation (Non-Medical): Not on file  Physical Activity:   . Days of Exercise per Week: Not on file  . Minutes of Exercise per Session: Not on file  Stress:   . Feeling of Stress : Not on file  Social Connections:   .  Frequency of Communication with Friends and Family: Not on file  . Frequency of Social Gatherings with Friends and Family: Not on file  . Attends Religious Services: Not on file  . Active Member of Clubs or Organizations: Not on file  . Attends Banker Meetings: Not on file  . Marital Status: Not on file  Intimate Partner Violence:   . Fear of Current or Ex-Partner: Not on file  . Emotionally Abused: Not on file  . Physically Abused: Not on file  . Sexually Abused: Not on file    Family History  Problem Relation Age of Onset  . Diabetes Maternal Grandfather   . Lung cancer Maternal Uncle     The following portions of the patient's history were reviewed and updated as appropriate: allergies, current medications, past family history, past medical history, past social history, past surgical history and problem list.  Review of  Systems Review of Systems - Negative except as mentioned in HPI Review of Systems - General ROS: negative for - chills, fatigue, fever, hot flashes, malaise or night sweats Hematological and Lymphatic ROS: negative for - bleeding problems or swollen lymph nodes Gastrointestinal ROS: negative for - abdominal pain, blood in stools, change in bowel habits and nausea/vomiting Musculoskeletal ROS: negative for - joint pain, muscle pain or muscular weakness Genito-Urinary ROS: negative for - change in menstrual cycle, dysmenorrhea, dyspareunia, dysuria, genital discharge, genital ulcers, hematuria, incontinence, irregular/heavy menses, nocturia or pelvic pain.  Objective:   LMP 08/24/2020 (Exact Date)  CONSTITUTIONAL: Well-developed, well-nourished female in no acute distress.  HENT:  Normocephalic, atraumatic.  NECK: Normal range of motion, supple, no masses.  Normal thyroid.  SKIN: Skin is warm and dry. No rash noted. Not diaphoretic. No erythema. No pallor. NEUROLGIC: Alert and oriented to person, place, and time. PSYCHIATRIC: Normal mood and affect. Normal behavior. Normal judgment and thought content. CARDIOVASCULAR:Not Examined RESPIRATORY: Not Examined BREASTS: Not Examined ABDOMEN: Soft, non distended; Non tender.  No Organomegaly. PELVIC:  External Genitalia: Normal  BUS: Normal  Vagina: Normal MUSCULOSKELETAL: Normal range of motion. No tenderness.  No cyanosis, clubbing, or edema.  Assessment:   History of Chlamydia     Plan:   Test of cure completed. Will follow up with results. Return PRN of for annual exam.   Doreene Burke, CNM

## 2020-09-23 LAB — GC/CHLAMYDIA PROBE AMP
Chlamydia trachomatis, NAA: NEGATIVE
Neisseria Gonorrhoeae by PCR: NEGATIVE

## 2020-11-04 ENCOUNTER — Ambulatory Visit (INDEPENDENT_AMBULATORY_CARE_PROVIDER_SITE_OTHER): Payer: Commercial Managed Care - PPO | Admitting: Certified Nurse Midwife

## 2020-11-04 ENCOUNTER — Other Ambulatory Visit: Payer: Self-pay

## 2020-11-04 ENCOUNTER — Encounter: Payer: Self-pay | Admitting: Certified Nurse Midwife

## 2020-11-04 VITALS — Ht 66.0 in | Wt 140.0 lb

## 2020-11-04 DIAGNOSIS — F32A Depression, unspecified: Secondary | ICD-10-CM

## 2020-11-04 DIAGNOSIS — F419 Anxiety disorder, unspecified: Secondary | ICD-10-CM

## 2020-11-04 MED ORDER — SERTRALINE HCL 50 MG PO TABS: 50.0000 mg | ORAL_TABLET | Freq: Every day | ORAL | 2 refills | Status: AC

## 2020-11-04 NOTE — Progress Notes (Signed)
Virtual Visit via Telephone Note  I connected with Stephanie May on 11/04/20 at  4:15 PM EST by telephone and verified that I am speaking with the correct person using two identifiers.  Location:  Patient: Stephanie May (home)  Provider: Serafina Royals, CNM ([facility], [facility])   I discussed the limitations, risks, security and privacy concerns of performing an evaluation and management service by telephone and the availability of in person appointments. I also discussed with the patient that there may be a patient responsible charge related to this service. The patient expressed understanding and agreed to proceed.   History of Present Illness:  Patient wishes to restart Zoloft for management of depression symptoms. Currently in the divorce process due to stress of situation.   No SI/HI.   Observations/Objective:  Depression screen [facility] 2/9 11/04/2020 05/16/2020 12/12/2017  Decreased Interest 1 3 3   Down, Depressed, Hopeless 1 2 1   PHQ - 2 Score 2 5 4   Altered sleeping 0 3 0  Tired, decreased energy 1 3 0  Change in appetite 1 2 2   Feeling bad or failure about yourself  1 0 0  Trouble concentrating 0 1 1  Moving slowly or fidgety/restless 1 2 0  Suicidal thoughts 0 0 0  PHQ-9 Score 6 16 7   Difficult doing work/chores Somewhat difficult Somewhat difficult -   GAD 7 : Generalized Anxiety Score 11/04/2020 11/23/2019  Nervous, Anxious, on Edge 2 1  Control/stop worrying 1 0  Worry too much - different things 2 1  Trouble relaxing 2 0  Restless 0 0  Easily annoyed or irritable 2 1  Afraid - awful might happen 0 0  Total GAD 7 Score 9 3  Anxiety Difficulty Somewhat difficult Not difficult at all    Assessment:  Anxiety and Depression  Plan:  Rx Zoloft, see orders  Reviewed red flag symptoms and when to call.   RTC x 4-5 weeks for mood check TELEVISIT or sooner if needed   Follow Up Instructions:    I discussed the assessment and treatment  plan with the patient. The patient was provided an opportunity to ask questions and all were answered. The patient agreed with the plan and demonstrated an understanding of the instructions.   The patient was advised to call back or seek an in-person evaluation if the symptoms worsen or if the condition fails to improve as anticipated.  I provided 6 minutes of non-face-to-face time during this encounter.   , CNM  [facility], [facility]

## 2020-11-05 NOTE — Patient Instructions (Signed)
Sertraline Tablets What is this medicine? SERTRALINE (SER tra leen) is used to treat depression. It may also be used to treat obsessive compulsive disorder, panic disorder, post-trauma stress disorder, premenstrual dysphoric disorder (PMDD) or social anxiety. This medicine may be used for other purposes; ask your health care provider or pharmacist if you have questions. COMMON BRAND NAME(S): Zoloft What should I tell my health care provider before I take this medicine? They need to know if you have any of these conditions:  bleeding disorders  bipolar disorder or a family history of bipolar disorder  glaucoma  heart disease  high blood pressure  history of irregular heartbeat  history of low levels of calcium, magnesium, or potassium in the blood  if you often drink alcohol  liver disease  receiving electroconvulsive therapy  seizures  suicidal thoughts, plans, or attempt; a previous suicide attempt by you or a family member  take medicines that treat or prevent blood clots  thyroid disease  an unusual or allergic reaction to sertraline, other medicines, foods, dyes, or preservatives  pregnant or trying to get pregnant  breast-feeding How should I use this medicine? Take this medicine by mouth with a glass of water. Follow the directions on the prescription label. You can take it with or without food. Take your medicine at regular intervals. Do not take your medicine more often than directed. Do not stop taking this medicine suddenly except upon the advice of your doctor. Stopping this medicine too quickly may cause serious side effects or your condition may worsen. A special MedGuide will be given to you by the pharmacist with each prescription and refill. Be sure to read this information carefully each time. Talk to your pediatrician regarding the use of this medicine in children. While this drug may be prescribed for children as young as 7 years for selected conditions,  precautions do apply. Overdosage: If you think you have taken too much of this medicine contact a poison control center or emergency room at once. NOTE: This medicine is only for you. Do not share this medicine with others. What if I miss a dose? If you miss a dose, take it as soon as you can. If it is almost time for your next dose, take only that dose. Do not take double or extra doses. What may interact with this medicine? Do not take this medicine with any of the following medications:  cisapride  dronedarone  linezolid  MAOIs like Carbex, Eldepryl, Marplan, Nardil, and Parnate  methylene blue (injected into a vein)  pimozide  thioridazine This medicine may also interact with the following medications:  alcohol  amphetamines  aspirin and aspirin-like medicines  certain medicines for depression, anxiety, or psychotic disturbances  certain medicines for fungal infections like ketoconazole, fluconazole, posaconazole, and itraconazole  certain medicines for irregular heart beat like flecainide, quinidine, propafenone  certain medicines for migraine headaches like almotriptan, eletriptan, frovatriptan, naratriptan, rizatriptan, sumatriptan, zolmitriptan  certain medicines for sleep  certain medicines for seizures like carbamazepine, valproic acid, phenytoin  certain medicines that treat or prevent blood clots like warfarin, enoxaparin, dalteparin  cimetidine  digoxin  diuretics  fentanyl  isoniazid  lithium  NSAIDs, medicines for pain and inflammation, like ibuprofen or naproxen  other medicines that prolong the QT interval (cause an abnormal heart rhythm) like dofetilide  rasagiline  safinamide  supplements like St. John's wort, kava kava, valerian  tolbutamide  tramadol  tryptophan This list may not describe all possible interactions. Give your health care   provider a list of all the medicines, herbs, non-prescription drugs, or dietary supplements  you use. Also tell them if you smoke, drink alcohol, or use illegal drugs. Some items may interact with your medicine. What should I watch for while using this medicine? Tell your doctor if your symptoms do not get better or if they get worse. Visit your doctor or health care professional for regular checks on your progress. Because it may take several weeks to see the full effects of this medicine, it is important to continue your treatment as prescribed by your doctor. Patients and their families should watch out for new or worsening thoughts of suicide or depression. Also watch out for sudden changes in feelings such as feeling anxious, agitated, panicky, irritable, hostile, aggressive, impulsive, severely restless, overly excited and hyperactive, or not being able to sleep. If this happens, especially at the beginning of treatment or after a change in dose, call your health care professional. You may get drowsy or dizzy. Do not drive, use machinery, or do anything that needs mental alertness until you know how this medicine affects you. Do not stand or sit up quickly, especially if you are an older patient. This reduces the risk of dizzy or fainting spells. Alcohol may interfere with the effect of this medicine. Avoid alcoholic drinks. Your mouth may get dry. Chewing sugarless gum or sucking hard candy, and drinking plenty of water may help. Contact your doctor if the problem does not go away or is severe. What side effects may I notice from receiving this medicine? Side effects that you should report to your doctor or health care professional as soon as possible:  allergic reactions like skin rash, itching or hives, swelling of the face, lips, or tongue  anxious  black, tarry stools  changes in vision  confusion  elevated mood, decreased need for sleep, racing thoughts, impulsive behavior  eye pain  fast, irregular heartbeat  feeling faint or lightheaded, falls  feeling agitated,  angry, or irritable  hallucination, loss of contact with reality  loss of balance or coordination  loss of memory  painful or prolonged erections  restlessness, pacing, inability to keep still  seizures  stiff muscles  suicidal thoughts or other mood changes  trouble sleeping  unusual bleeding or bruising  unusually weak or tired  vomiting Side effects that usually do not require medical attention (report to your doctor or health care professional if they continue or are bothersome):  change in appetite or weight  change in sex drive or performance  diarrhea  increased sweating  indigestion, nausea  tremors This list may not describe all possible side effects. Call your doctor for medical advice about side effects. You may report side effects to FDA at 1-800-FDA-1088. Where should I keep my medicine? Keep out of the reach of children. Store at room temperature between 15 and 30 degrees C (59 and 86 degrees F). Throw away any unused medicine after the expiration date. NOTE: This sheet is a summary. It may not cover all possible information. If you have questions about this medicine, talk to your doctor, pharmacist, or health care provider.  2021 Elsevier/Gold Standard (2020-08-18 09:18:23)  

## 2020-11-29 ENCOUNTER — Ambulatory Visit (INDEPENDENT_AMBULATORY_CARE_PROVIDER_SITE_OTHER): Payer: Commercial Managed Care - PPO | Admitting: Certified Nurse Midwife

## 2020-11-29 ENCOUNTER — Other Ambulatory Visit: Payer: Self-pay

## 2020-11-29 ENCOUNTER — Encounter: Payer: Self-pay | Admitting: Certified Nurse Midwife

## 2020-11-29 VITALS — BP 106/70 | HR 63 | Ht 66.0 in | Wt 144.1 lb

## 2020-11-29 DIAGNOSIS — Z01419 Encounter for gynecological examination (general) (routine) without abnormal findings: Secondary | ICD-10-CM

## 2020-11-29 NOTE — Progress Notes (Signed)
GYNECOLOGY ANNUAL PREVENTATIVE CARE ENCOUNTER NOTE  History:     Stephanie May is a 25 y.o. [phone] female here for a routine annual gynecologic exam.  Current complaints: none   Denies abnormal vaginal bleeding, discharge, pelvic pain, problems with intercourse or other gynecologic concerns.     Social Relationship: in relationship , new partner Living: alone Work: Producer, television/film/video Exercise: none Smoke/Alcohol/drug use: occ alcohol use, denies drug& smoking.  Gynecologic History Patient's last menstrual period was 11/14/2020 (exact date). Contraception: none, state she has OCP starting next month Last Pap: 10/30/2018 Results were: normal  Last mammogram: n/a   Upstream - 11/29/20 1012      Pregnancy Intention Screening   Does the patient want to become pregnant in the next year? No    Does the patient's partner want to become pregnant in the next year? No    Would the patient like to discuss contraceptive options today? No      Contraception Wrap Up   Current Method Female Condom    End Method Female Condom    Contraception Counseling Provided No          The pregnancy intention screening data noted above was reviewed. Potential methods of contraception were discussed. The patient elected to proceed with Oral Contraceptive.    Obstetric History OB History  Gravida Para Term Preterm AB Living  2 2 2     2   SAB IAB Ectopic Multiple Live Births        0 2    # Outcome Date GA Lbr Len/2nd Weight Sex Delivery Anes PTL Lv  2 Term 07/05/19 [redacted]w[redacted]d / 00:24 8 lb 1.5 oz (3.67 kg) F Vag-Spont None  LIV  1 Term 10/31/17 [redacted]w[redacted]d 02:50 / 00:26 6 lb 10.9 oz (3.03 kg) F Vag-Spont EPI  LIV    Past Medical History:  Diagnosis Date  . Chlamydia   . History of chlamydia infection 2 9 16   . Irregular menses   . Medical history non-contributory   . PCOS (polycystic ovarian syndrome)   . Pelvic pain in female   . Primary dysmenorrhea   . Sexually transmitted disease (STD)     previous h/o  . Unexplained weight gain     Past Surgical History:  Procedure Laterality Date  . NO PAST SURGERIES      Current Outpatient Medications on File Prior to Visit  Medication Sig Dispense Refill  . sertraline (ZOLOFT) 50 MG tablet Take 1 tablet (50 mg total) by mouth daily. 30 tablet 2   No current facility-administered medications on file prior to visit.    Allergies  Allergen Reactions  . Gluten Meal   . Lactose Intolerance (Gi)   . Macrobid [Nitrofurantoin Macrocrystal]   . Sulfa Antibiotics     Social History:  reports that she has never smoked. She has never used smokeless tobacco. She reports that she does not drink alcohol and does not use drugs.  Family History  Problem Relation Age of Onset  . Diabetes Maternal Grandfather   . Lung cancer Maternal Uncle     The following portions of the patient's history were reviewed and updated as appropriate: allergies, current medications, past family history, past medical history, past social history, past surgical history and problem list.  Review of Systems Pertinent items noted in HPI and remainder of comprehensive ROS otherwise negative.  Physical Exam:  BP 106/70   Pulse 63   Ht 5\' 6"  (1.676 m)   Wt 144  lb 2 oz (65.4 kg)   LMP 11/14/2020 (Exact Date)   BMI 23.26 kg/m  CONSTITUTIONAL: Well-developed, well-nourished female in no acute distress.  HENT:  Normocephalic, atraumatic, External right and left ear normal. Oropharynx is clear and moist EYES: Conjunctivae and EOM are normal. Pupils are equal, round, and reactive to light. No scleral icterus.  NECK: Normal range of motion, supple, no masses.  Normal thyroid.  SKIN: Skin is warm and dry. No rash noted. Not diaphoretic. No erythema. No pallor. MUSCULOSKELETAL: Normal range of motion. No tenderness.  No cyanosis, clubbing, or edema.  2+ distal pulses. NEUROLOGIC: Alert and oriented to person, place, and time. Normal reflexes, muscle tone coordination.   PSYCHIATRIC: Normal mood and affect. Normal behavior. Normal judgment and thought content. CARDIOVASCULAR: Normal heart rate noted, regular rhythm RESPIRATORY: Clear to auscultation bilaterally. Effort and breath sounds normal, no problems with respiration noted. BREASTS: Symmetric in size. No masses, tenderness, skin changes, nipple drainage, or lymphadenopathy bilaterally.  ABDOMEN: Soft, no distention noted.  No tenderness, rebound or guarding.  PELVIC: Normal appearing external genitalia and urethral meatus; normal appearing vaginal mucosa and cervix.  No abnormal discharge noted.  Pap smear not indicated  Normal uterine size, no other palpable masses, no uterine or adnexal tenderness.  .   Assessment and Plan:    1. Women's annual routine gynecological examination  Pap: not due until 2023 Mammogram : n/a Labs: none Refills: none Referral: none Routine preventative health maintenance measures emphasized. Please refer to After Visit Summary for other counseling recommendations.      Doreene Burke, CNM [facility],  [facility]

## 2020-11-29 NOTE — Patient Instructions (Signed)
Preventive Care 21-24 Years Old, Female Preventive care refers to lifestyle choices and visits with your health care provider that can promote health and wellness. This includes:  A yearly physical exam. This is also called an annual wellness visit.  Regular dental and eye exams.  Immunizations.  Screening for certain conditions.  Healthy lifestyle choices, such as: ? Eating a healthy diet. ? Getting regular exercise. ? Not using drugs or products that contain nicotine and tobacco. ? Limiting alcohol use. What can I expect for my preventive care visit? Physical exam Your health care provider may check your:  Height and weight. These may be used to calculate your BMI (body mass index). BMI is a measurement that tells if you are at a healthy weight.  Heart rate and blood pressure.  Body temperature.  Skin for abnormal spots. Counseling Your health care provider may ask you questions about your:  Past medical problems.  Family's medical history.  Alcohol, tobacco, and drug use.  Emotional well-being.  Home life and relationship well-being.  Sexual activity.  Diet, exercise, and sleep habits.  Work and work environment.  Access to firearms.  Method of birth control.  Menstrual cycle.  Pregnancy history. What immunizations do I need? Vaccines are usually given at various ages, according to a schedule. Your health care provider will recommend vaccines for you based on your age, medical history, and lifestyle or other factors, such as travel or where you work.   What tests do I need? Blood tests  Lipid and cholesterol levels. These may be checked every 5 years starting at age 20.  Hepatitis C test.  Hepatitis B test. Screening  Diabetes screening. This is done by checking your blood sugar (glucose) after you have not eaten for a while (fasting).  STD (sexually transmitted disease) testing, if you are at risk.  BRCA-related cancer screening. This may be  done if you have a family history of breast, ovarian, tubal, or peritoneal cancers.  Pelvic exam and Pap test. This may be done every 3 years starting at age 21. Starting at age 30, this may be done every 5 years if you have a Pap test in combination with an HPV test. Talk with your health care provider about your test results, treatment options, and if necessary, the need for more tests.   Follow these instructions at home: Eating and drinking  Eat a healthy diet that includes fresh fruits and vegetables, whole grains, lean protein, and low-fat dairy products.  Take vitamin and mineral supplements as recommended by your health care provider.  Do not drink alcohol if: ? Your health care provider tells you not to drink. ? You are pregnant, may be pregnant, or are planning to become pregnant.  If you drink alcohol: ? Limit how much you have to 0-1 drink a day. ? Be aware of how much alcohol is in your drink. In the [place] one drink equals one 12 oz bottle of beer (355 mL), one 5 oz glass of wine (148 mL), or one 1 oz glass of hard liquor (44 mL).   Lifestyle  Take daily care of your teeth and gums. Brush your teeth every morning and night with fluoride toothpaste. Floss one time each day.  Stay active. Exercise for at least 30 minutes 5 or more days each week.  Do not use any products that contain nicotine or tobacco, such as cigarettes, e-cigarettes, and chewing tobacco. If you need help quitting, ask your health care provider.  Do not   use drugs.  If you are sexually active, practice safe sex. Use a condom or other form of protection to prevent STIs (sexually transmitted infections).  If you do not wish to become pregnant, use a form of birth control. If you plan to become pregnant, see your health care provider for a prepregnancy visit.  Find healthy ways to cope with stress, such as: ? Meditation, yoga, or listening to music. ? Journaling. ? Talking to a trusted  person. ? Spending time with friends and family. Safety  Always wear your seat belt while driving or riding in a vehicle.  Do not drive: ? If you have been drinking alcohol. Do not ride with someone who has been drinking. ? When you are tired or distracted. ? While texting.  Wear a helmet and other protective equipment during sports activities.  If you have firearms in your house, make sure you follow all gun safety procedures.  Seek help if you have been physically or sexually abused. What's next?  Go to your health care provider once a year for an annual wellness visit.  Ask your health care provider how often you should have your eyes and teeth checked.  Stay up to date on all vaccines. This information is not intended to replace advice given to you by your health care provider. Make sure you discuss any questions you have with your health care provider. Document Revised: 06/05/2020 Document Reviewed: 06/19/2018 Elsevier Patient Education  2021 Elsevier Inc.  

## 2020-12-23 NOTE — Telephone Encounter (Signed)
F/u   Patient is checking on the status of previous messages.  Asking for a call back.

## 2021-01-10 ENCOUNTER — Ambulatory Visit (INDEPENDENT_AMBULATORY_CARE_PROVIDER_SITE_OTHER): Payer: Commercial Managed Care - PPO | Admitting: Certified Nurse Midwife

## 2021-01-10 ENCOUNTER — Other Ambulatory Visit (HOSPITAL_COMMUNITY)
Admission: RE | Admit: 2021-01-10 | Discharge: 2021-01-10 | Disposition: A | Discharge status: Home / Self Care | Payer: BC Managed Care – PPO | Source: Ambulatory Visit | Attending: Certified Nurse Midwife | Admitting: Certified Nurse Midwife

## 2021-01-10 ENCOUNTER — Encounter: Payer: Self-pay | Admitting: Certified Nurse Midwife

## 2021-01-10 ENCOUNTER — Other Ambulatory Visit: Payer: Self-pay

## 2021-01-10 VITALS — BP 103/64 | HR 68 | Ht 66.0 in | Wt 149.9 lb

## 2021-01-10 DIAGNOSIS — N898 Other specified noninflammatory disorders of vagina: Secondary | ICD-10-CM | POA: Insufficient documentation

## 2021-01-10 NOTE — Progress Notes (Signed)
GYN ENCOUNTER NOTE  Subjective:       Stephanie May is a 25 y.o. [phone] female is here for gynecologic evaluation of the following issues:  1. Vaginal spotting and itching . Pt states she started on the pill a few weeks ago and has had spotting , brownish discharge intermittently over the past few weeks. She has noticed increased irritation/itching over this past week. She is concerned about infection and request vaginal swab with STD testing today.      Gynecologic History Patient's last menstrual period was 01/08/2021 (approximate). Contraception: OCP (estrogen/progesterone) Last Pap: 10/30/2018. Results were: normal Last mammogram: n/a   Obstetric History OB History  Gravida Para Term Preterm AB Living  2 2 2     2   SAB IAB Ectopic Multiple Live Births        0 2    # Outcome Date GA Lbr Len/2nd Weight Sex Delivery Anes PTL Lv  2 Term 07/05/19 [redacted]w[redacted]d / 00:24 8 lb 1.5 oz (3.67 kg) F Vag-Spont None  LIV  1 Term 10/31/17 [redacted]w[redacted]d 02:50 / 00:26 6 lb 10.9 oz (3.03 kg) F Vag-Spont EPI  LIV    Past Medical History:  Diagnosis Date  . Chlamydia   . History of chlamydia infection 2 9 16   . Irregular menses   . Medical history non-contributory   . PCOS (polycystic ovarian syndrome)   . Pelvic pain in female   . Primary dysmenorrhea   . Sexually transmitted disease (STD)    previous h/o  . Unexplained weight gain     Past Surgical History:  Procedure Laterality Date  . NO PAST SURGERIES      Current Outpatient Medications on File Prior to Visit  Medication Sig Dispense Refill  . Norgestimate-Ethinyl Estradiol Triphasic (ORTHO TRI-CYCLEN, 28,) 0.18/0.215/0.25 MG-35 MCG tablet Take 1 tablet by mouth daily.    . sertraline (ZOLOFT) 50 MG tablet Take 1 tablet (50 mg total) by mouth daily. 30 tablet 2   No current facility-administered medications on file prior to visit.    Allergies  Allergen Reactions  . Gluten Meal   . Lactose Intolerance (Gi)   . Macrobid  [Nitrofurantoin Macrocrystal]   . Sulfa Antibiotics     Social History   Socioeconomic History  . Marital status: Legally Separated    Spouse name: Not on file  . Number of children: 1  . Years of education: 106  . Highest education level: High school graduate  Occupational History  . Not on file  Tobacco Use  . Smoking status: Never Smoker  . Smokeless tobacco: Never Used  Vaping Use  . Vaping Use: Never used  Substance and Sexual Activity  . Alcohol use: No  . Drug use: No  . Sexual activity: Yes    Partners: Male    Birth control/protection: Condom, Pill  Other Topics Concern  . Not on file  Social History Narrative   ** Merged History Encounter **       Social Determinants of Health   Financial Resource Strain: Not on file  Food Insecurity: Not on file  Transportation Needs: Not on file  Physical Activity: Not on file  Stress: Not on file  Social Connections: Not on file  Intimate Partner Violence: Not on file    Family History  Problem Relation Age of Onset  . Diabetes Maternal Grandfather   . Lung cancer Maternal Uncle   . Breast cancer Neg Hx   . Ovarian cancer Neg Hx   .  Colon cancer Neg Hx     The following portions of the patient's history were reviewed and updated as appropriate: allergies, current medications, past family history, past medical history, past social history, past surgical history and problem list.  Review of Systems Review of Systems - Negative except as mentioend in HPI Review of Systems - General ROS: negative for - chills, fatigue, fever, hot flashes, malaise or night sweats Hematological and Lymphatic ROS: negative for - bleeding problems or swollen lymph nodes Gastrointestinal ROS: negative for - abdominal pain, blood in stools, change in bowel habits and nausea/vomiting Musculoskeletal ROS: negative for - joint pain, muscle pain or muscular weakness Genito-Urinary ROS: negative for - change in menstrual cycle, dysmenorrhea,  dyspareunia, dysuria, genital discharge, genital ulcers, hematuria, incontinence, irregular/heavy menses, nocturia or pelvic pain. Vaginal spotting and itching  Objective:   BP 103/64   Pulse 68   Ht 5\' 6"  (1.676 m)   Wt 149 lb 14.4 oz (68 kg)   LMP 01/08/2021 (Approximate)   Breastfeeding No   BMI 24.19 kg/m  CONSTITUTIONAL: Well-developed, well-nourished female in no acute distress.  HENT:  Normocephalic, atraumatic.  NECK: Normal range of motion, supple, no masses.  Normal thyroid.  SKIN: Skin is warm and dry. No rash noted. Not diaphoretic. No erythema. No pallor. NEUROLGIC: Alert and oriented to person, place, and time. PSYCHIATRIC: Normal mood and affect. Normal behavior. Normal judgment and thought content. CARDIOVASCULAR:Not Examined RESPIRATORY: Not Examined BREASTS: Not Examined ABDOMEN: Soft, non distended; Non tender.  No Organomegaly. PELVIC:  External Genitalia: Normal  BUS: Normal  Vagina: Normal, brown and red blood present , no odor, mild redness  Cervix: Normal MUSCULOSKELETAL: Normal range of motion. No tenderness.  No cyanosis, clubbing, or edema.     Assessment:   Vaginal itching Vaginal spotting    Plan:   Reassurance given regarding spotting . Discussed not uncommon with start of new birth control method. Recommended to give 3-4 months on pill to allow it to get into an normal cycle. Suggest that if she continues to have spotting after 3 months to let me know and we can use a different pill. Discussed use of boric acid or monistat. Will follow up with result. Follow up prn or for annual exam.   01/10/2021, CNM

## 2021-01-10 NOTE — Patient Instructions (Signed)
Vaginitis  Vaginitis is a condition in which the vaginal tissue swells and becomes irritated. This condition is most often caused by a change in the normal balance of bacteria and yeast that live in the vagina. This change causes an overgrowth of certain bacteria or yeast, which causes the inflammation. There are different types of vaginitis. What are the causes? The cause of this condition depends on the type of vaginitis. It can be caused by:  Bacteria (bacterial vaginosis).  Yeast, which is a fungus (candidiasis).  A parasite (trichomoniasis vaginitis).  A virus (viral vaginitis).  Low hormone levels (atrophic vaginitis). Low hormone levels can occur during pregnancy, breastfeeding, or after menopause.  Irritants, such as bubble baths, scented tampons, and feminine sprays (allergic vaginitis). Other factors can change the normal balance of the yeast and bacteria that live in the vagina. These include:  Antibiotic medicines.  Poor hygiene.  Diaphragms, vaginal sponges, spermicides, birth control pills, and intrauterine devices (IUDs).  Sex.  Infection.  Uncontrolled diabetes.  A weakened body defense system (immune system). What increases the risk? This condition is more likely to develop in women who:  Smoke or are exposed to secondhand smoke.  Use vaginal douches, scented tampons, or scented sanitary pads.  Wear tight-fitting pants or thong underwear.  Use oral birth control pills or an IUD.  Have sex without a condom or have multiple partners.  Have an STI.  Frequently use the spermicide nonoxynol-9.  Eat lots of foods high in sugar or who have uncontrolled diabetes.  Have low estrogen levels.  Have a weakened immune system from an immune disorder or medical treatment.  Are pregnant or breastfeeding. What are the signs or symptoms? Symptoms vary depending on the cause of the vaginitis. Common symptoms include:  Abnormal vaginal discharge. ? The  discharge is white, gray, or yellow with bacterial vaginosis. ? The discharge is thick, white, and cheesy with a yeast infection. ? The discharge is frothy and yellow or greenish with trichomoniasis.  A bad vaginal smell. The smell is fishy with bacterial vaginosis.  Vaginal itching, pain, or swelling.  Pain with sex.  Pain or burning when urinating. Sometimes there are no symptoms. How is this diagnosed? This condition is diagnosed based on your symptoms and medical history. A physical exam, including a pelvic exam, will also be done. You may also have other tests, including:  Tests to determine the pH level (acidity or alkalinity) of your vagina.  A whiff test to assess the odor that results when a sample of your vaginal discharge is mixed with a potassium hydroxide solution.  Tests of vaginal fluid. A sample will be examined under a microscope. How is this treated? Treatment varies depending on the type of vaginitis you have. Your treatment may include:  Antibiotic creams or pills to treat bacterial vaginosis and trichomoniasis.  Antifungal medicines, such as vaginal creams or suppositories, to treat a yeast infection.  Medicine to ease discomfort if you have viral vaginitis. Your sexual partner should also be treated.  Estrogen delivered in a cream, pill, suppository, or vaginal ring to treat atrophic vaginitis. If vaginal dryness occurs, lubricants and moisturizing creams may help. You may need to avoid scented soaps, sprays, or douches.  Stopping use of a product that is causing allergic vaginitis and then using a vaginal cream to treat the symptoms. Follow these instructions at home: Lifestyle  Keep your genital area clean and dry. Avoid soap, and only rinse the area with water.  Do not douche   or use tampons until your health care provider says it is okay. Use sanitary pads, if needed.  Do not have sex until your health care provider approves. When you can return to sex,  practice safe sex and use condoms.  Wipe from front to back. This avoids the spread of bacteria from the rectum to the vagina. General instructions  Take over-the-counter and prescription medicines only as told by your health care provider.  If you were prescribed an antibiotic medicine, take or use it as told by your health care provider. Do not stop taking or using the antibiotic even if you start to feel better.  Keep all follow-up visits. This is important. How is this prevented?  Use mild, unscented products. Do not use things that can irritate the vagina, such as fabric softeners. Avoid the following products if they are scented: ? Feminine sprays. ? Detergents. ? Tampons. ? Feminine hygiene products. ? Soaps or bubble baths.  Let air reach your genital area. To do this: ? Wear cotton underwear to reduce moisture buildup. ? Avoid wearing underwear while you sleep. ? Avoid wearing tight pants and underwear or nylons without a cotton panel. ? Avoid wearing thong underwear.  Take off any wet clothing, such as bathing suits, as soon as possible.  Practice safe sex and use condoms. Contact a health care provider if:  You have abdominal or pelvic pain.  You have a fever or chills.  You have symptoms that last for more than 2-3 days. Get help right away if:  You have a fever and your symptoms suddenly get worse. Summary  Vaginitis is a condition in which the vaginal tissue becomes inflamed.This condition is most often caused by a change in the normal balance of bacteria and yeast that live in the vagina.  Treatment varies depending on the type of vaginitis you have.  Do not douche, use tampons, or have sex until your health care provider approves. When you can return to sex, practice safe sex and use condoms. This information is not intended to replace advice given to you by your health care provider. Make sure you discuss any questions you have with your health care  provider. Document Revised: 04/07/2020 Document Reviewed: 04/07/2020 Elsevier Patient Education  2021 Elsevier Inc.  

## 2021-01-11 ENCOUNTER — Other Ambulatory Visit: Payer: Self-pay | Admitting: Certified Nurse Midwife

## 2021-01-11 LAB — CERVICOVAGINAL ANCILLARY ONLY
Bacterial Vaginitis (gardnerella): NEGATIVE
Candida Glabrata: NEGATIVE
Candida Vaginitis: POSITIVE — AB
Chlamydia: NEGATIVE
Comment: NEGATIVE
Comment: NEGATIVE
Comment: NEGATIVE
Comment: NEGATIVE
Comment: NEGATIVE
Comment: NORMAL
Neisseria Gonorrhea: NEGATIVE
Trichomonas: NEGATIVE

## 2021-01-11 MED ORDER — FLUCONAZOLE 150 MG PO TABS: 150.0000 mg | ORAL_TABLET | Freq: Once | ORAL | 0 refills | Status: AC

## 2021-01-20 ENCOUNTER — Other Ambulatory Visit: Payer: Self-pay | Admitting: Certified Nurse Midwife

## 2021-01-20 MED ORDER — TRANEXAMIC ACID 650 MG PO TABS: 1300.0000 mg | ORAL_TABLET | Freq: Three times a day (TID) | ORAL | 0 refills | Status: AC

## 2021-01-24 ENCOUNTER — Other Ambulatory Visit: Payer: Self-pay

## 2021-01-24 ENCOUNTER — Encounter: Payer: Self-pay | Admitting: Certified Nurse Midwife

## 2021-01-24 ENCOUNTER — Ambulatory Visit (INDEPENDENT_AMBULATORY_CARE_PROVIDER_SITE_OTHER): Payer: Commercial Managed Care - PPO | Admitting: Certified Nurse Midwife

## 2021-01-24 VITALS — BP 116/74 | HR 69 | Resp 16 | Ht 66.0 in | Wt 149.5 lb

## 2021-01-24 DIAGNOSIS — Z79899 Other long term (current) drug therapy: Secondary | ICD-10-CM | POA: Diagnosis not present

## 2021-01-24 NOTE — Patient Instructions (Signed)

## 2021-01-24 NOTE — Progress Notes (Signed)
Subjective:    Stephanie May is a 25 y.o. female who presents for contraception counseling. The patient has no complaints today. The patient is sexually active. Pertinent past medical history: none. She is currently on OCP and has had bleeding since restarting last month. She has used advil and does not wish to try the lysteda. She is wanting something to stop the bleeding.   Menstrual History: OB History    Gravida  2   Para  2   Term  2   Preterm      AB      Living  2     SAB      IAB      Ectopic      Multiple  0   Live Births  2            Patient's last menstrual period was 01/08/2021 (approximate).    The following portions of the patient's history were reviewed and updated as appropriate: allergies, current medications, past family history, past medical history, past social history, past surgical history and problem list.  Review of Systems Pertinent items are noted in HPI.   Objective:    No exam performed today, not indicated to discussed [facility] options.   Assessment:    25 y.o., starting IUD, no contraindications.   Plan:    All questions answered.  Reviewed  birth control options available that are hormonal to help control bleeding as well as be good  contraceptive medication. We discussed changing to a different pill, use of patch, ring, injection,contraceptive implant; hormonal and nonhormonal IUDs. She is interested in the mirena IUD. Discussed benefits and risks of IUD, reviewed procedure for placement.   Questions were answered.  Information was given to patient to review.  She will follow up with appointment for placement.   Doreene Burke, CNM

## 2021-01-25 ENCOUNTER — Ambulatory Visit (INDEPENDENT_AMBULATORY_CARE_PROVIDER_SITE_OTHER): Payer: Commercial Managed Care - PPO | Admitting: Certified Nurse Midwife

## 2021-01-25 ENCOUNTER — Encounter: Payer: Self-pay | Admitting: Certified Nurse Midwife

## 2021-01-25 VITALS — BP 119/77 | HR 98 | Resp 16 | Ht 66.0 in | Wt 148.0 lb

## 2021-01-25 DIAGNOSIS — Z30430 Encounter for insertion of intrauterine contraceptive device: Secondary | ICD-10-CM

## 2021-01-25 NOTE — Patient Instructions (Signed)

## 2021-01-25 NOTE — Progress Notes (Signed)
     GYNECOLOGY OFFICE PROCEDURE NOTE  Stephanie May is a 25 y.o. [phone] here for mirena IUD insertion. No GYN concerns.  Last pap smear was on 10/30/2018 and was normal.  IUD Insertion Procedure Note Patient identified, informed consent performed, consent signed.   Discussed risks of irregular bleeding, cramping, infection, malpositioning or misplacement of the IUD outside the uterus which may require further procedure such as laparoscopy. Also discussed >99% contraception efficacy, increased risk of ectopic pregnancy with failure of method.   Emphasized that this did not protect against STIs, condoms recommended during all sexual encounters. Time out was performed.  Urine pregnancy test negative.  Speculum placed in the vagina.  Cervix visualized.  Cleaned with Betadine x 2.  Grasped anteriorly with a single tooth tenaculum.  Uterus sounded to 8 cm.  Mirena IUD placed per manufacturer's recommendations.  Strings trimmed to 3 cm. Tenaculum was removed, good hemostasis noted.  Patient tolerated procedure well.   Patient was given post-procedure instructions.  She was advised to have backup contraception for one week.  Patient was also asked to check IUD strings periodically and follow up in 4 weeks for IUD check.   Doreene Burke, CNM

## 2021-02-22 ENCOUNTER — Ambulatory Visit (INDEPENDENT_AMBULATORY_CARE_PROVIDER_SITE_OTHER): Payer: Commercial Managed Care - PPO | Admitting: Certified Nurse Midwife

## 2021-02-22 ENCOUNTER — Encounter: Payer: Self-pay | Admitting: Certified Nurse Midwife

## 2021-02-22 ENCOUNTER — Other Ambulatory Visit: Payer: Self-pay

## 2021-02-22 VITALS — BP 112/75 | HR 54 | Resp 16 | Ht 66.0 in | Wt 145.7 lb

## 2021-02-22 DIAGNOSIS — Z30431 Encounter for routine checking of intrauterine contraceptive device: Secondary | ICD-10-CM

## 2021-02-22 NOTE — Patient Instructions (Signed)

## 2021-02-22 NOTE — Progress Notes (Signed)
   GYNECOLOGY OFFICE ENCOUNTER NOTE  History:  25 y.o. [id number] here today for today for IUD string check; Mirena  IUD was placed 01/25/2021. No complaints about the IUD, no concerning side effects.  The following portions of the patient's history were reviewed and updated as appropriate: allergies, current medications, past family history, past medical history, past social history, past surgical history and problem list. Last pap smear on 10/30/18 was normal, negative.  Review of Systems:  Pertinent items are noted in HPI.   Objective:  Physical Exam Blood pressure 112/75, pulse (!) 54, resp. rate 16, height 5\' 6"  (1.676 m), weight 145 lb 11.2 oz (66.1 kg), not currently breastfeeding. CONSTITUTIONAL: Well-developed, well-nourished female in no acute distress.  HENT:  Normocephalic, atraumatic. External right and left ear normal. Oropharynx is clear and moist EYES: Conjunctivae and EOM are normal. Pupils are equal, round, and reactive to light. No scleral icterus.  NECK: Normal range of motion, supple, no masses CARDIOVASCULAR: Normal heart rate noted RESPIRATORY: Effort and breath sounds normal, no problems with respiration noted ABDOMEN: Soft, no distention noted.   PELVIC: Normal appearing external genitalia; normal appearing vaginal mucosa and cervix.  IUD strings visualized, about 3 cm in length outside cervix.   Assessment & Plan:  Patient to keep IUD in place for up to seven years; can come in for removal if she desires pregnancy earlier or for any concerning side effects.  , CNM

## 2021-05-17 ENCOUNTER — Encounter: Payer: Self-pay | Admitting: Certified Nurse Midwife

## 2021-05-17 ENCOUNTER — Encounter: Payer: Commercial Managed Care - PPO | Admitting: Certified Nurse Midwife

## 2021-06-21 ENCOUNTER — Ambulatory Visit (INDEPENDENT_AMBULATORY_CARE_PROVIDER_SITE_OTHER): Payer: Medicaid Other | Admitting: Certified Nurse Midwife

## 2021-06-21 ENCOUNTER — Encounter: Payer: Self-pay | Admitting: Certified Nurse Midwife

## 2021-06-21 ENCOUNTER — Other Ambulatory Visit: Payer: Self-pay

## 2021-06-21 VITALS — BP 123/77 | HR 65 | Ht 66.0 in | Wt 138.5 lb

## 2021-06-21 DIAGNOSIS — T148XXA Other injury of unspecified body region, initial encounter: Secondary | ICD-10-CM | POA: Diagnosis not present

## 2021-06-21 DIAGNOSIS — R238 Other skin changes: Secondary | ICD-10-CM

## 2021-06-21 DIAGNOSIS — Z30431 Encounter for routine checking of intrauterine contraceptive device: Secondary | ICD-10-CM | POA: Diagnosis not present

## 2021-06-21 DIAGNOSIS — R233 Spontaneous ecchymoses: Secondary | ICD-10-CM
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
## 2021-06-21 NOTE — Patient Instructions (Signed)

## 2021-06-22 LAB — PT AND PTT
INR: 1 (ref 0.9–1.2)
Prothrombin Time: 10.7 s (ref 9.1–12.0)
aPTT: 27 s (ref 24–33)

## 2021-06-22 NOTE — Progress Notes (Signed)
Pt informed of lab results, verbalized understanding. No further questions/concerns voiced from pt at this time.

## 2021-06-27 ENCOUNTER — Other Ambulatory Visit: Payer: Self-pay | Admitting: Obstetrics and Gynecology

## 2021-06-27 DIAGNOSIS — K5229 Other allergic and dietetic gastroenteritis and colitis: Secondary | ICD-10-CM

## 2021-06-28 ENCOUNTER — Other Ambulatory Visit: Payer: Self-pay

## 2021-06-28 NOTE — Progress Notes (Signed)
[facility] encounter

## 2021-07-04 ENCOUNTER — Inpatient Hospital Stay: Payer: Medicaid Other | Attending: Oncology | Admitting: Oncology

## 2021-07-04 ENCOUNTER — Encounter: Payer: Self-pay | Admitting: Oncology

## 2021-07-04 ENCOUNTER — Inpatient Hospital Stay: Payer: Medicaid Other

## 2021-07-04 VITALS — BP 106/73 | HR 62 | Temp 97.2°F | Resp 18 | Wt 140.1 lb

## 2021-07-04 DIAGNOSIS — Z883 Allergy status to other anti-infective agents status: Secondary | ICD-10-CM

## 2021-07-04 DIAGNOSIS — Z801 Family history of malignant neoplasm of trachea, bronchus and lung: Secondary | ICD-10-CM | POA: Diagnosis not present

## 2021-07-04 DIAGNOSIS — Z833 Family history of diabetes mellitus: Secondary | ICD-10-CM | POA: Diagnosis not present

## 2021-07-04 DIAGNOSIS — R238 Other skin changes: Secondary | ICD-10-CM

## 2021-07-04 DIAGNOSIS — Z881 Allergy status to other antibiotic agents status: Secondary | ICD-10-CM | POA: Diagnosis not present

## 2021-07-04 DIAGNOSIS — R233 Spontaneous ecchymoses: Secondary | ICD-10-CM | POA: Insufficient documentation

## 2021-07-04 DIAGNOSIS — Z882 Allergy status to sulfonamides status: Secondary | ICD-10-CM | POA: Insufficient documentation

## 2021-07-04 DIAGNOSIS — Z79899 Other long term (current) drug therapy: Secondary | ICD-10-CM | POA: Insufficient documentation

## 2021-07-04 DIAGNOSIS — T148XXA Other injury of unspecified body region, initial encounter: Secondary | ICD-10-CM | POA: Diagnosis not present

## 2021-07-04 LAB — CBC WITH DIFFERENTIAL/PLATELET
Abs Immature Granulocytes: 0.01 10*3/uL (ref 0.00–0.07)
Basophils Absolute: 0 10*3/uL (ref 0.0–0.1)
Basophils Relative: 0 %
Eosinophils Absolute: 0 10*3/uL (ref 0.0–0.5)
Eosinophils Relative: 0 %
HCT: 42.4 % (ref 36.0–46.0)
Hemoglobin: 14.8 g/dL (ref 12.0–15.0)
Immature Granulocytes: 0 %
Lymphocytes Relative: 31 %
Lymphs Abs: 1.4 10*3/uL (ref 0.7–4.0)
MCH: 31.1 pg (ref 26.0–34.0)
MCHC: 34.9 g/dL (ref 30.0–36.0)
MCV: 89.1 fL (ref 80.0–100.0)
Monocytes Absolute: 0.2 10*3/uL (ref 0.1–1.0)
Monocytes Relative: 5 %
Neutro Abs: 3 10*3/uL (ref 1.7–7.7)
Neutrophils Relative %: 64 %
Platelets: 218 10*3/uL (ref 150–400)
RBC: 4.76 MIL/uL (ref 3.87–5.11)
RDW: 11.9 % (ref 11.5–15.5)
WBC: 4.6 10*3/uL (ref 4.0–10.5)
nRBC: 0 % (ref 0.0–0.2)

## 2021-07-04 LAB — COMPREHENSIVE METABOLIC PANEL
ALT: 13 U/L (ref 0–44)
AST: 16 U/L (ref 15–41)
Albumin: 4.8 g/dL (ref 3.5–5.0)
Alkaline Phosphatase: 51 U/L (ref 38–126)
Anion gap: 7 (ref 5–15)
BUN: 13 mg/dL (ref 6–20)
CO2: 28 mmol/L (ref 22–32)
Calcium: 9.7 mg/dL (ref 8.9–10.3)
Chloride: 103 mmol/L (ref 98–111)
Creatinine, Ser: 0.84 mg/dL (ref 0.44–1.00)
GFR, Estimated: >60 mL/min (ref >60)
Glucose, Bld: 95 mg/dL (ref 70–99)
Potassium: 3.9 mmol/L (ref 3.5–5.1)
Sodium: 138 mmol/L (ref 135–145)
Total Bilirubin: 1 mg/dL (ref 0.3–1.2)
Total Protein: 7.9 g/dL (ref 6.5–8.1)

## 2021-07-04 LAB — PLATELET FUNCTION ASSAY: Collagen / Epinephrine: 164 seconds (ref 0–193)

## 2021-07-04 LAB — PROTIME-INR
INR: 1 (ref 0.8–1.2)
Prothrombin Time: 13.6 seconds (ref 11.4–15.2)

## 2021-07-04 LAB — FIBRINOGEN: Fibrinogen: 195 mg/dL — ABNORMAL LOW (ref 210–475)

## 2021-07-04 LAB — LACTATE DEHYDROGENASE: LDH: 122 U/L (ref 98–192)

## 2021-07-04 LAB — APTT: aPTT: 29 seconds (ref 24–36)

## 2021-07-06 LAB — THROMBIN TIME: Thrombin Time: 19.6 s (ref 0.0–23.0)

## 2021-07-06 LAB — FACTOR 9 ASSAY: Coagulation Factor IX: 90 % (ref 60–177)

## 2021-07-06 LAB — VON WILLEBRAND PANEL
Coagulation Factor VIII: 91 % (ref 56–140)
Ristocetin Co-factor, Plasma: 56 % (ref 50–200)
Von Willebrand Antigen, Plasma: 69 % (ref 50–200)

## 2021-07-06 LAB — COAG STUDIES INTERP REPORT

## 2021-07-06 LAB — FACTOR 11 ASSAY: Factor XI Activity: 93 % (ref 60–150)

## 2021-07-06 NOTE — Progress Notes (Signed)
Hematology/Oncology Consult note [facility] [phone] [phone]  Patient Care Team: Pcp, No as PCP - General   Name of the patient: Stephanie May  [id number]  18-Aug-1996    Reason for referral-easy bruising   Referring physician-Annie Janee Morn, CNM  Date of visit: 07/06/21   History of presenting illness-patient is a 25 year old female with no significant past medical history who has noticed random bruises especially in her bilateral thighs as well as arms which have come on randomly in the absence of obvious trauma.  This has been going on for the last 2 months.  She has not had any prior bleeding or bruising issues.  Menstrual cycles are somewhat irregular on Mirena but no significant menorrhagia.  No prior history of dental extractions or other surgeries.  She has had 2 pregnancies which were otherwise unremarkable without any significant bleeding complications.  No family history of bleeding disorder.  Denies any consistent use of NSAIDs although she takes an occasional ibuprofen for headaches.  ECOG PS- 0  Pain scale- 0   Review of systems- Review of Systems  Constitutional:  Negative for chills, fever, malaise/fatigue and weight loss.  HENT:  Negative for congestion, ear discharge and nosebleeds.   Eyes:  Negative for blurred vision.  Respiratory:  Negative for cough, hemoptysis, sputum production, shortness of breath and wheezing.   Cardiovascular:  Negative for chest pain, palpitations, orthopnea and claudication.  Gastrointestinal:  Negative for abdominal pain, blood in stool, constipation, diarrhea, heartburn, melena, nausea and vomiting.  Genitourinary:  Negative for dysuria, flank pain, frequency, hematuria and urgency.  Musculoskeletal:  Negative for back pain, joint pain and myalgias.  Skin:  Negative for rash.  Neurological:  Negative for dizziness, tingling, focal weakness, seizures, weakness and headaches.   Endo/Heme/Allergies:  Bruises/bleeds easily.  Psychiatric/Behavioral:  Negative for depression and suicidal ideas. The patient does not have insomnia.    Allergies  Allergen Reactions   Gluten Meal    Lactose Intolerance (Gi)    Macrobid [Nitrofurantoin Macrocrystal]    Sulfa Antibiotics     Patient Active Problem List   Diagnosis Date Noted   Abnormal bruising 06/21/2021     Past Medical History:  Diagnosis Date   Chlamydia    History of chlamydia infection 2 9 16    Irregular menses    Medical history non-contributory    PCOS (polycystic ovarian syndrome)    Pelvic pain in female    Primary dysmenorrhea    Sexually transmitted disease (STD)    previous h/o   Unexplained weight gain      Past Surgical History:  Procedure Laterality Date   NO PAST SURGERIES      Social History   Socioeconomic History   Marital status: Legally Separated    Spouse name: Not on file   Number of children: 1   Years of education: 12   Highest education level: High school graduate  Occupational History   Not on file  Tobacco Use   Smoking status: Never   Smokeless tobacco: Never  Vaping Use   Vaping Use: Never used  Substance and Sexual Activity   Alcohol use: No   Drug use: No   Sexual activity: Yes    Partners: Male    Birth control/protection: Condom, Pill  Other Topics Concern   Not on file  Social History Narrative   ** Merged History Encounter **       Social Determinants of Health   Financial Resource Strain: Not  on file  Food Insecurity: Not on file  Transportation Needs: Not on file  Physical Activity: Not on file  Stress: Not on file  Social Connections: Not on file  Intimate Partner Violence: Not on file     Family History  Problem Relation Age of Onset   Diabetes Maternal Grandfather    Lung cancer Maternal Uncle    Breast cancer Neg Hx    Ovarian cancer Neg Hx    Colon cancer Neg Hx      Current Outpatient Medications:    sertraline  (ZOLOFT) 50 MG tablet, Take 1 tablet (50 mg total) by mouth daily., Disp: 30 tablet, Rfl: 2   Physical exam:  Vitals:   07/04/21 1105  BP: 106/73  Pulse: 62  Resp: 18  Temp: (!) 97.2 F (36.2 C)  SpO2: 99%  Weight: 140 lb 2 oz (63.6 kg)   Physical Exam Cardiovascular:     Rate and Rhythm: Normal rate and regular rhythm.     Heart sounds: Normal heart sounds.  Pulmonary:     Effort: Pulmonary effort is normal.     Breath sounds: Normal breath sounds.  Abdominal:     General: Bowel sounds are normal.     Palpations: Abdomen is soft.  Skin:    General: Skin is warm and dry.     Comments: Small subcentimeter fading bruise noted over right lower extremity and another 1 over the right arm.  No big areas of bruising noted  Neurological:     Mental Status: She is alert and oriented to person, place, and time.       CMP Latest Ref Rng & Units 07/04/2021  Glucose 70 - 99 mg/dL 95  BUN 6 - 20 mg/dL 13  Creatinine 2.53 - 6.64 mg/dL 4.03  Sodium 474 - 259 mmol/L 138  Potassium 3.5 - 5.1 mmol/L 3.9  Chloride 98 - 111 mmol/L 103  CO2 22 - 32 mmol/L 28  Calcium 8.9 - 10.3 mg/dL 9.7  Total Protein 6.5 - 8.1 g/dL 7.9  Total Bilirubin 0.3 - 1.2 mg/dL 1.0  Alkaline Phos 38 - 126 U/L 51  AST 15 - 41 U/L 16  ALT 0 - 44 U/L 13    Assessment and plan- Patient is a 25 y.o. female referred for abnormal bruising  Patient has noted abnormal bruising for the last 2 months.  She has no big bruises to show today but I did look at her pictures from 2 months ago.  No prior surgeries to suggest inherited bleeding diathesis.  No consistent use of NSAIDs or over-the-counter supplements.  No family history of bleeding disorder.  2 prior uneventful pregnancies.  We will check CBC with differential PT PTT INR fibrinogen thrombin time von Willebrand panel, PFA, As well as factor IX and XI assay as well as immunoglobulins.  I will see her back in 2 weeks time to discuss the results of blood  work   Thank you for this kind referral and the opportunity to participate in the care of this  Patient   Visit Diagnosis 1. Easy bruising     Dr. Owens Shark, MD, MPH [facility] at [facility] [phone] 07/06/2021

## 2021-07-08 ENCOUNTER — Encounter: Payer: Self-pay | Admitting: Oncology

## 2021-07-08 LAB — IMMUNOGLOBULINS A/E/G/M, SERUM
IgA: 372 mg/dL — ABNORMAL HIGH (ref 87–352)
IgE (Immunoglobulin E), Serum: 7 IU/mL (ref 6–495)
IgG (Immunoglobin G), Serum: 1244 mg/dL (ref 586–1602)
IgM (Immunoglobulin M), Srm: 61 mg/dL (ref 26–217)

## 2021-07-21 ENCOUNTER — Inpatient Hospital Stay (HOSPITAL_BASED_OUTPATIENT_CLINIC_OR_DEPARTMENT_OTHER): Payer: Medicaid Other | Admitting: Oncology

## 2021-07-21 DIAGNOSIS — D682 Hereditary deficiency of other clotting factors: Secondary | ICD-10-CM | POA: Diagnosis not present

## 2021-07-21 DIAGNOSIS — R233 Spontaneous ecchymoses: Secondary | ICD-10-CM

## 2021-07-21 DIAGNOSIS — R238 Other skin changes: Secondary | ICD-10-CM | POA: Diagnosis not present

## 2021-07-21 NOTE — Progress Notes (Signed)
Pt confirmed availability for virtual visit as scheduled. Pt has questions regarding if celiac disease can have effect on lab work. Pt also endorses intermittent nausea/vomiting. No other concerns/complaints at this time.

## 2021-07-23 ENCOUNTER — Encounter: Payer: Self-pay | Admitting: Oncology

## 2021-07-23 NOTE — Progress Notes (Signed)
I connected with Stephanie May on 07/23/21 at 10:30 AM EDT by video enabled telemedicine visit and verified that I am speaking with the correct person using two identifiers.   I discussed the limitations, risks, security and privacy concerns of performing an evaluation and management service by telemedicine and the availability of in-person appointments. I also discussed with the patient that there may be a patient responsible charge related to this service. The patient expressed understanding and agreed to proceed.  Other persons participating in the visit and their role in the encounter:  none  Patient's location:  work Provider's location:  Insurance underwriter Complaint: Discuss results of blood work  History of present illness: -patient is a 25 year old female with no significant past medical history who has noticed random bruises especially in her bilateral thighs as well as arms which have come on randomly in the absence of obvious trauma.  This has been going on for the last 2 months.  She has not had any prior bleeding or bruising issues.  Menstrual cycles are somewhat irregular on Mirena but no significant menorrhagia.  No prior history of dental extractions or other surgeries.  She has had 2 pregnancies which were otherwise unremarkable without any significant bleeding complications.  No family history of bleeding disorder.  Denies any consistent use of NSAIDs although she takes an occasional ibuprofen for headaches.  Results of blood work from 07/04/2021 were as follows: CBC was normal with an H&H of 14.8/42.4 and a platelet count of 218.  PT/INR normal at 13.6/1.  aPTT normal at 29.  LDH normal.  Von Willebrand panel normal.  Thrombin time, factor IX, factor XI assay normal.  Platelet function assay normal.  Quantitative immunoglobulins showed mildly elevated IgA of 372.  Fibrinogen level mildly low at 195    Interval history patient states that she has not had significant bruising since her  last visit.  Overall bruising has gone down.  She gets occasional small bruises here and there.   Review of Systems  Constitutional:  Negative for chills, fever, malaise/fatigue and weight loss.  HENT:  Negative for congestion, ear discharge and nosebleeds.   Eyes:  Negative for blurred vision.  Respiratory:  Negative for cough, hemoptysis, sputum production, shortness of breath and wheezing.   Cardiovascular:  Negative for chest pain, palpitations, orthopnea and claudication.  Gastrointestinal:  Negative for abdominal pain, blood in stool, constipation, diarrhea, heartburn, melena, nausea and vomiting.  Genitourinary:  Negative for dysuria, flank pain, frequency, hematuria and urgency.  Musculoskeletal:  Negative for back pain, joint pain and myalgias.  Skin:  Negative for rash.  Neurological:  Negative for dizziness, tingling, focal weakness, seizures, weakness and headaches.  Endo/Heme/Allergies:  Bruises/bleeds easily.  Psychiatric/Behavioral:  Negative for depression and suicidal ideas. The patient does not have insomnia.    Allergies  Allergen Reactions   Gluten Meal    Lactose Intolerance (Gi)    Macrobid [Nitrofurantoin Macrocrystal]    Sulfa Antibiotics     Past Medical History:  Diagnosis Date   Chlamydia    History of chlamydia infection 2 9 16    Irregular menses    Medical history non-contributory    PCOS (polycystic ovarian syndrome)    Pelvic pain in female    Primary dysmenorrhea    Sexually transmitted disease (STD)    previous h/o   Unexplained weight gain     Past Surgical History:  Procedure Laterality Date   NO PAST SURGERIES      Social History  Socioeconomic History   Marital status: Legally Separated    Spouse name: Not on file   Number of children: 1   Years of education: 70   Highest education level: High school graduate  Occupational History   Not on file  Tobacco Use   Smoking status: Never   Smokeless tobacco: Never  Vaping Use    Vaping Use: Never used  Substance and Sexual Activity   Alcohol use: No   Drug use: No   Sexual activity: Yes    Partners: Male    Birth control/protection: Condom, Pill  Other Topics Concern   Not on file  Social History Narrative   ** Merged History Encounter **       Social Determinants of Health   Financial Resource Strain: Not on file  Food Insecurity: Not on file  Transportation Needs: Not on file  Physical Activity: Not on file  Stress: Not on file  Social Connections: Not on file  Intimate Partner Violence: Not on file    Family History  Problem Relation Age of Onset   Diabetes Maternal Grandfather    Lung cancer Maternal Uncle    Breast cancer Neg Hx    Ovarian cancer Neg Hx    Colon cancer Neg Hx      Current Outpatient Medications:    sertraline (ZOLOFT) 50 MG tablet, Take 1 tablet (50 mg total) by mouth daily., Disp: 30 tablet, Rfl: 2  No results found.  No images are attached to the encounter.   CMP Latest Ref Rng & Units 07/04/2021  Glucose 70 - 99 mg/dL 95  BUN 6 - 20 mg/dL 13  Creatinine 7.56 - 4.33 mg/dL 2.95  Sodium 188 - 416 mmol/L 138  Potassium 3.5 - 5.1 mmol/L 3.9  Chloride 98 - 111 mmol/L 103  CO2 22 - 32 mmol/L 28  Calcium 8.9 - 10.3 mg/dL 9.7  Total Protein 6.5 - 8.1 g/dL 7.9  Total Bilirubin 0.3 - 1.2 mg/dL 1.0  Alkaline Phos 38 - 126 U/L 51  AST 15 - 41 U/L 16  ALT 0 - 44 U/L 13   CBC Latest Ref Rng & Units 07/04/2021  WBC 4.0 - 10.5 K/uL 4.6  Hemoglobin 12.0 - 15.0 g/dL 60.6  Hematocrit 30.1 - 46.0 % 42.4  Platelets 150 - 400 K/uL 218     Observation/objective: Appears in no acute distress over video visit today.  Breathing is nonlabored  Assessment and plan: Patient is a 25 year old female referred for easy bruising and this visit to discuss results of blood work  Results of blood work including CBC, PT/INR, von Willebrand activity, platelet function assay, factor IX factor XI LDH were all normal.  Immunoglobulin levels  IgA were mildly elevated but this is not contributing to her easy bruising.  Patient was noted to have a mildly lowFibrinogen level of 195.  Typically fibrinogen levels less than 150 are considered to be significant and I will plan to repeat fibrinogen antigen as well as activity in about 6 weeks time followed by a telephone visit.  If levels are persistently low she may need further work-up and I may have to refer her to [facility] for that.  Also patient knows that if she has any repeated episodes of bruising I will have a low threshold to refer her to [facility]  Follow-up instructions: As above  I discussed the assessment and treatment plan with the patient. The patient was provided an opportunity to ask questions and all were answered. The  patient agreed with the plan and demonstrated an understanding of the instructions.   The patient was advised to call back or seek an in-person evaluation if the symptoms worsen or if the condition fails to improve as anticipated.  Visit Diagnosis: 1. Fibrinogen decreased (HCC)   2. Easy bruising     Dr. Owens Shark, MD, MPH [facility] at [facility] Tel- [phone] 07/23/2021 6:28 AM

## 2021-08-09 ENCOUNTER — Other Ambulatory Visit: Payer: Self-pay

## 2021-08-09 ENCOUNTER — Other Ambulatory Visit (HOSPITAL_COMMUNITY)
Admission: RE | Admit: 2021-08-09 | Discharge: 2021-08-09 | Disposition: A | Discharge status: Home / Self Care | Payer: Commercial Managed Care - PPO | Source: Ambulatory Visit | Attending: Certified Nurse Midwife | Admitting: Certified Nurse Midwife

## 2021-08-09 ENCOUNTER — Ambulatory Visit (INDEPENDENT_AMBULATORY_CARE_PROVIDER_SITE_OTHER): Payer: Commercial Managed Care - PPO | Admitting: Certified Nurse Midwife

## 2021-08-09 DIAGNOSIS — N898 Other specified noninflammatory disorders of vagina: Secondary | ICD-10-CM | POA: Insufficient documentation

## 2021-08-09 NOTE — Progress Notes (Signed)
Pt presents for self swab with complaints of vaginal itching, vaginal odor, cramping & bleeding x1 week. Pt requested full swab. All patient questions answered.

## 2021-08-10 DIAGNOSIS — N898 Other specified noninflammatory disorders of vagina: Secondary | ICD-10-CM | POA: Insufficient documentation

## 2021-08-10 NOTE — Progress Notes (Signed)
PT self swab, did not see her.   Doreene Burke, CNM

## 2021-08-11 ENCOUNTER — Other Ambulatory Visit: Payer: Self-pay

## 2021-08-11 LAB — CERVICOVAGINAL ANCILLARY ONLY

## 2021-08-14 ENCOUNTER — Other Ambulatory Visit (HOSPITAL_COMMUNITY)
Admission: RE | Admit: 2021-08-14 | Discharge: 2021-08-14 | Disposition: A | Discharge status: Home / Self Care | Payer: Medicaid Other | Source: Ambulatory Visit | Attending: Certified Nurse Midwife | Admitting: Certified Nurse Midwife

## 2021-08-14 ENCOUNTER — Ambulatory Visit: Payer: Commercial Managed Care - PPO | Admitting: Certified Nurse Midwife

## 2021-08-14 ENCOUNTER — Other Ambulatory Visit: Payer: Self-pay

## 2021-08-14 DIAGNOSIS — N898 Other specified noninflammatory disorders of vagina: Secondary | ICD-10-CM | POA: Diagnosis present

## 2021-08-14 DIAGNOSIS — Z113 Encounter for screening for infections with a predominantly sexual mode of transmission: Secondary | ICD-10-CM | POA: Insufficient documentation

## 2021-08-14 NOTE — Progress Notes (Signed)
Patient came in today to repeat Aptima swab. She did a self swab and she punctured the top of the foil and lab would not accept. She repeated test today and it has been sent off.

## 2021-08-17 ENCOUNTER — Other Ambulatory Visit: Payer: Self-pay

## 2021-08-17 ENCOUNTER — Other Ambulatory Visit: Payer: Self-pay | Admitting: Certified Nurse Midwife

## 2021-08-17 DIAGNOSIS — B9689 Other specified bacterial agents as the cause of diseases classified elsewhere: Secondary | ICD-10-CM

## 2021-08-17 DIAGNOSIS — N760 Acute vaginitis: Secondary | ICD-10-CM

## 2021-08-17 LAB — CERVICOVAGINAL ANCILLARY ONLY
Bacterial Vaginitis (gardnerella): POSITIVE — AB
Candida Glabrata: NEGATIVE
Candida Vaginitis: NEGATIVE
Chlamydia: NEGATIVE
Comment: NEGATIVE
Comment: NEGATIVE
Comment: NEGATIVE
Comment: NEGATIVE
Comment: NEGATIVE
Comment: NORMAL
Neisseria Gonorrhea: NEGATIVE
Trichomonas: NEGATIVE

## 2021-08-17 MED ORDER — METRONIDAZOLE 500 MG PO TABS: 500.0000 mg | ORAL_TABLET | Freq: Two times a day (BID) | ORAL | 0 refills | Status: DC

## 2021-08-30 ENCOUNTER — Inpatient Hospital Stay: Payer: Commercial Managed Care - PPO | Attending: Oncology

## 2021-09-01 ENCOUNTER — Other Ambulatory Visit: Payer: Self-pay

## 2021-09-01 ENCOUNTER — Inpatient Hospital Stay: Payer: Commercial Managed Care - PPO | Admitting: Oncology

## 2021-09-01 ENCOUNTER — Telehealth: Payer: Self-pay | Admitting: Oncology

## 2021-09-01 ENCOUNTER — Encounter: Payer: Self-pay | Admitting: Oncology

## 2021-09-01 NOTE — Telephone Encounter (Signed)
Attempt made to reach patient to reschedule missed appointment on 09/01/21. No answer and the mailbox was full. Sending a Letter in the mail.

## 2021-09-08 ENCOUNTER — Other Ambulatory Visit: Payer: Self-pay | Admitting: Certified Nurse Midwife

## 2021-09-08 ENCOUNTER — Encounter: Payer: Self-pay | Admitting: Certified Nurse Midwife

## 2021-09-08 DIAGNOSIS — N760 Acute vaginitis: Secondary | ICD-10-CM

## 2021-09-08 DIAGNOSIS — B9689 Other specified bacterial agents as the cause of diseases classified elsewhere: Secondary | ICD-10-CM

## 2021-09-08 MED ORDER — METRONIDAZOLE 500 MG PO TABS
500.0000 mg | ORAL_TABLET | Freq: Two times a day (BID) | ORAL | 0 refills | Status: DC
Start: 2021-09-08 — End: 2021-09-08

## 2021-11-28 ENCOUNTER — Encounter: Payer: Self-pay | Admitting: Certified Nurse Midwife

## 2021-11-29 ENCOUNTER — Ambulatory Visit (INDEPENDENT_AMBULATORY_CARE_PROVIDER_SITE_OTHER): Payer: Commercial Managed Care - PPO | Admitting: Certified Nurse Midwife

## 2021-11-29 ENCOUNTER — Other Ambulatory Visit: Payer: Self-pay

## 2021-11-29 ENCOUNTER — Encounter: Payer: Self-pay | Admitting: Certified Nurse Midwife

## 2021-11-29 VITALS — BP 117/82 | HR 58 | Temp 97.9°F | Ht 66.0 in | Wt 136.3 lb

## 2021-11-29 DIAGNOSIS — R399 Unspecified symptoms and signs involving the genitourinary system: Secondary | ICD-10-CM | POA: Diagnosis not present

## 2021-11-29 NOTE — Patient Instructions (Signed)

## 2021-11-29 NOTE — Progress Notes (Signed)
Subjective:    Stephanie May is a 26 y.o. female who complains of dysuria, frequency, and urgency for 2 days.  Patient also complains of back pain. Patient denies fever and vaginal discharge.   Pt state she took Azo yesterday and did tele health visit yesterday , they ordered Macrobid for her she has take one dose. State she feels 10 x better today .   The following portions of the patient's history were reviewed and updated as appropriate: allergies, current medications, past family history, past medical history, past social history, past surgical history, and problem list. Review of Systems Pertinent items are noted in HPI.    Objective:    There were no vitals taken for this visit. General: alert, cooperative, and appears stated age  Abdomen: soft, non-tender, without masses or organomegaly in the entire abdomen  Back: CVA tenderness absent  GU: defer exam   Laboratory:  Urine dipstick shows  unable to did due to AZO use .      Assessment:    UTI    Plan: Plan:    1. Medications:  Macrobid ordered yesterday by tele health provider 2. Maintain adequate hydration 3. Urine culture sent  4. Follow up if symptoms not improving, and prn.   Doreene Burke, CNM

## 2021-11-30 ENCOUNTER — Telehealth: Payer: Self-pay | Admitting: Certified Nurse Midwife

## 2021-11-30 ENCOUNTER — Encounter: Payer: Self-pay | Admitting: Certified Nurse Midwife

## 2021-11-30 NOTE — Telephone Encounter (Signed)
Pts culture has not yet resulted from yesterday's appt. Message has been relayed to provider, waiting for provider response.

## 2021-11-30 NOTE — Telephone Encounter (Signed)
Pt called stating that she woke up this morning with bloating, full bladder sensation but can not urinate. Nausea and vomiting, pt has been on ABX for two days now, states she did not have these symptoms yesterday during apt. Please Advise.

## 2021-12-01 LAB — URINE CULTURE: Organism ID, Bacteria: NO GROWTH

## 2021-12-04 NOTE — Progress Notes (Signed)
LM with pt relaying culture results; waiting for pt response in regards to SX.

## 2022-01-25 ENCOUNTER — Encounter: Payer: Self-pay | Admitting: Certified Nurse Midwife

## 2022-04-17 ENCOUNTER — Encounter: Payer: Self-pay | Admitting: Certified Nurse Midwife

## 2022-04-18 ENCOUNTER — Encounter: Payer: Self-pay | Admitting: Certified Nurse Midwife

## 2022-04-19 ENCOUNTER — Ambulatory Visit (INDEPENDENT_AMBULATORY_CARE_PROVIDER_SITE_OTHER): Payer: Commercial Managed Care - PPO | Admitting: Certified Nurse Midwife

## 2022-04-19 ENCOUNTER — Encounter: Payer: Self-pay | Admitting: Certified Nurse Midwife

## 2022-04-19 VITALS — BP 128/78 | HR 92 | Ht 66.0 in | Wt 139.1 lb

## 2022-04-19 DIAGNOSIS — Z30431 Encounter for routine checking of intrauterine contraceptive device: Secondary | ICD-10-CM

## 2022-04-19 NOTE — Progress Notes (Signed)
  GYNECOLOGY OFFICE ENCOUNTER NOTE  History:  26 y.o. [id number] here today for today for IUD string check; Mirena  IUD was placed  02/14/21.  States that she was having intercourse with her partner she experienced a large amount of bleeding. She denied  any pain during the episode but is concerned about placement. She sometimes has left lower quadrant pain . She does have a history of ovarian cysts  The following portions of the patient's history were reviewed and updated as appropriate: allergies, current medications, past family history, past medical history, past social history, past surgical history and problem list. Last pap smear on 10/30/2018 was normal.   Review of Systems:  Pertinent items are noted in HPI.   Objective:  Physical Exam Blood pressure 128/78, pulse 92, height 5\' 6"  (1.676 m), weight 139 lb 1.6 oz (63.1 kg). CONSTITUTIONAL: Well-developed, well-nourished female in no acute distress.  NEUROLOGIC: Alert and oriented to person, place, and time. Normal reflexes, muscle tone coordination.  PSYCHIATRIC: Normal mood and affect. Normal behavior. Normal judgment and thought content. CARDIOVASCULAR: Normal heart rate noted RESPIRATORY: Effort and breath sounds normal, no problems with respiration noted ABDOMEN: Soft, no distention noted.   PELVIC: Normal appearing external genitalia; normal appearing vaginal mucosa and cervix.  IUD strings visualized, about 3 cm in length outside cervix. Done in the presence of a chaperone. Small amount of old blood noted. No active bleeding , no polyps noted.   Assessment & Plan:  Reassurance given to patient. U/s ordered to confirm correct internal placement. Rule out ovarian cyst. She would like to keep IUD in place at this time. Discussed other possible causes of bleeding . Will follow up with u/s results.   , CNM

## 2022-04-19 NOTE — Patient Instructions (Signed)

## 2022-04-25 ENCOUNTER — Telehealth: Payer: Self-pay | Admitting: Certified Nurse Midwife

## 2022-04-25 NOTE — Telephone Encounter (Signed)
Spoke with pt- made her aware of [place] apt- also made her aware I sent mychart message with apt info as well.

## 2022-05-02 ENCOUNTER — Ambulatory Visit: Payer: Commercial Managed Care - PPO | Attending: Certified Nurse Midwife

## 2023-01-15 ENCOUNTER — Ambulatory Visit (INDEPENDENT_AMBULATORY_CARE_PROVIDER_SITE_OTHER): Payer: Commercial Managed Care - PPO | Admitting: Certified Nurse Midwife

## 2023-01-15 ENCOUNTER — Encounter: Payer: Self-pay | Admitting: Certified Nurse Midwife

## 2023-01-15 ENCOUNTER — Other Ambulatory Visit (HOSPITAL_COMMUNITY)
Admission: RE | Admit: 2023-01-15 | Discharge: 2023-01-15 | Disposition: A | Discharge status: Home / Self Care | Payer: Commercial Managed Care - PPO | Source: Ambulatory Visit | Attending: Certified Nurse Midwife | Admitting: Certified Nurse Midwife

## 2023-01-15 VITALS — BP 129/84 | HR 52 | Ht 66.0 in | Wt 140.0 lb

## 2023-01-15 DIAGNOSIS — N898 Other specified noninflammatory disorders of vagina: Secondary | ICD-10-CM | POA: Insufficient documentation

## 2023-01-15 DIAGNOSIS — Z124 Encounter for screening for malignant neoplasm of cervix: Secondary | ICD-10-CM

## 2023-01-15 DIAGNOSIS — Z30431 Encounter for routine checking of intrauterine contraceptive device: Secondary | ICD-10-CM | POA: Diagnosis not present

## 2023-01-15 NOTE — Progress Notes (Signed)
   GYNECOLOGY OFFICE ENCOUNTER NOTE  History:  27 y.o. [id number] here today for today for IUD string check; Mirena  IUD was placed  02/14/2021. She also complains of vaginal discharge with odor. She is over due for pap and request to have done today.   1.IUD check 2. Pap smear 3. Vaginal discharge  The following portions of the patient's history were reviewed and updated as appropriate: allergies, current medications, past family history, past medical history, past social history, past surgical history and problem list. Last pap smear on 10/30/2018 was normal, negative HRHPV.  Review of Systems:  Pertinent items are noted in HPI.   Objective:  Physical Exam Height 5\' 6"  (1.676 m), weight 140 lb (63.5 kg). CONSTITUTIONAL: Well-developed, well-nourished female in no acute distress.  NEUROLOGIC: Alert and oriented to person, place, and time. Normal reflexes, muscle tone coordination.  PSYCHIATRIC: Normal mood and affect. Normal behavior. Normal judgment and thought content. CARDIOVASCULAR: Normal heart rate noted RESPIRATORY: Effort and breath sounds normal, no problems with respiration noted ABDOMEN: Soft, no distention noted.   PELVIC: Normal appearing external genitalia; normal appearing cervix. Yellow /white discharge noted.  IUD strings visualized, about 3 cm in length outside cervix. Pap collected Done in the presence of a chaperone.   Assessment & Plan:  Patient to keep IUD in place 2030 ; can come in for removal earlier if she desires or for any concerning side effects. Recommended condoms for STI prevention.  Pap collected  Vaginal swab collected  Will follow up with results. Return for annual or prn.   Philip Aspen, CNM

## 2023-01-16 ENCOUNTER — Other Ambulatory Visit: Payer: Self-pay | Admitting: Certified Nurse Midwife

## 2023-01-16 ENCOUNTER — Encounter: Payer: Self-pay | Admitting: Certified Nurse Midwife

## 2023-01-16 LAB — CERVICOVAGINAL ANCILLARY ONLY
Bacterial Vaginitis (gardnerella): POSITIVE — AB
Candida Glabrata: NEGATIVE
Candida Vaginitis: POSITIVE — AB
Chlamydia: NEGATIVE
Comment: NEGATIVE
Comment: NEGATIVE
Comment: NEGATIVE
Comment: NEGATIVE
Comment: NEGATIVE
Comment: NORMAL
Neisseria Gonorrhea: NEGATIVE
Trichomonas: NEGATIVE

## 2023-01-16 MED ORDER — METRONIDAZOLE 500 MG PO TABS: 500.0000 mg | ORAL_TABLET | Freq: Two times a day (BID) | ORAL | 0 refills | Status: AC

## 2023-01-16 MED ORDER — FLUCONAZOLE 150 MG PO TABS: 150.0000 mg | ORAL_TABLET | Freq: Once | ORAL | 1 refills | Status: AC

## 2023-01-22 LAB — CYTOLOGY - PAP
Comment: NEGATIVE
Comment: NEGATIVE
Comment: NEGATIVE
Diagnosis: UNDETERMINED — AB
HPV 16: NEGATIVE
HPV 18 / 45: NEGATIVE
High risk HPV: POSITIVE — AB

## 2023-01-23 ENCOUNTER — Encounter: Payer: Self-pay | Admitting: Certified Nurse Midwife

## 2023-02-18 ENCOUNTER — Encounter: Payer: Self-pay | Admitting: Obstetrics & Gynecology

## 2023-02-18 ENCOUNTER — Ambulatory Visit (INDEPENDENT_AMBULATORY_CARE_PROVIDER_SITE_OTHER): Payer: Commercial Managed Care - PPO | Admitting: Obstetrics & Gynecology

## 2023-02-18 ENCOUNTER — Other Ambulatory Visit (HOSPITAL_COMMUNITY)
Admission: RE | Admit: 2023-02-18 | Discharge: 2023-02-18 | Disposition: A | Discharge status: Home / Self Care | Payer: Commercial Managed Care - PPO | Source: Ambulatory Visit | Attending: Obstetrics & Gynecology | Admitting: Obstetrics & Gynecology

## 2023-02-18 ENCOUNTER — Other Ambulatory Visit (HOSPITAL_COMMUNITY)
Admission: RE | Admit: 2023-02-18 | Discharge: 2023-02-18 | Disposition: A | Discharge status: Home / Self Care | Payer: Commercial Managed Care - PPO | Source: Ambulatory Visit

## 2023-02-18 VITALS — BP 129/84 | HR 70 | Ht 66.0 in | Wt 139.6 lb

## 2023-02-18 DIAGNOSIS — N898 Other specified noninflammatory disorders of vagina: Secondary | ICD-10-CM | POA: Diagnosis not present

## 2023-02-18 DIAGNOSIS — N870 Mild cervical dysplasia: Secondary | ICD-10-CM | POA: Diagnosis not present

## 2023-02-18 DIAGNOSIS — R87810 Cervical high risk human papillomavirus (HPV) DNA test positive: Secondary | ICD-10-CM | POA: Insufficient documentation

## 2023-02-18 DIAGNOSIS — R87610 Atypical squamous cells of undetermined significance on cytologic smear of cervix (ASC-US): Secondary | ICD-10-CM | POA: Insufficient documentation

## 2023-02-18 NOTE — Progress Notes (Signed)
   Established Patient Office Visit  Subjective   Patient ID: Stephanie May, female    DOB: 04/02/1996  Age: 27 y.o. MRN: [id number]  No chief complaint on file.   HPI    27 yo single P2 is here for a colpo. Her recent pap showed ASCUS + HR HPV (not 16 or 18). She uses Mirena for contraception and is happy with it.  Objective:     BP 129/84   Pulse 70   Ht 5\' 6"  (1.676 m)   Wt 139 lb 9.6 oz (63.3 kg)   BMI 22.53 kg/m    Physical Exam   Well nourished, well hydrated White female, no apparent distress She is ambulating and conversing normally. Consent signed, time out done Cervix prepped with acetic acid. Transformation zone seen in its entirety. Colpo adequate. 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 Silver nitrate achieved hemostasis Mirena IUD string seen. ECC obtained. She tolerated the procedure well.  Assessment & Plan:  ASCUS + HR HPV pap She is not a smoker and had Gardasil in the past.  I will send her a Mychart message when pathology results are available. I told her that I will not be in town for the next few months.  I discussed a Leep should it be necessary.  Problem List Items Addressed This Visit   None   No follow-ups on file.    Allie Bossier, MD

## 2023-02-18 NOTE — Addendum Note (Signed)
Addended by: Cornelius Moras D on: 02/18/2023 01:49 PM   Modules accepted: Orders

## 2023-02-19 ENCOUNTER — Other Ambulatory Visit: Payer: Self-pay | Admitting: Obstetrics & Gynecology

## 2023-02-19 ENCOUNTER — Encounter: Payer: Self-pay | Admitting: Obstetrics & Gynecology

## 2023-02-19 DIAGNOSIS — N760 Acute vaginitis: Secondary | ICD-10-CM

## 2023-02-19 LAB — CERVICOVAGINAL ANCILLARY ONLY
Bacterial Vaginitis (gardnerella): POSITIVE — AB
Comment: NEGATIVE
Comment: NEGATIVE
Trichomonas: NEGATIVE

## 2023-02-19 MED ORDER — METRONIDAZOLE 500 MG PO TABS: 500.0000 mg | ORAL_TABLET | Freq: Two times a day (BID) | ORAL | 0 refills | Status: DC

## 2023-02-19 NOTE — Progress Notes (Signed)
Flagyl prescribed for bv seen on Aptima testing

## 2023-02-20 LAB — SURGICAL PATHOLOGY

## 2023-02-28 ENCOUNTER — Telehealth: Payer: Self-pay

## 2023-02-28 NOTE — Telephone Encounter (Signed)
Patient called into triage asking for someone to call her with her results she has reviewed them but doesn't understand them. Can you please review with them with her since Dr. Marice Potter isn't here anymore.

## 2023-02-28 NOTE — Telephone Encounter (Signed)
Attempted to contact patient to discuss her results as requested.  No answer, voicemail left.  Will also send patient a MyChart message regarding her lab testing.  Dr. Valentino Saxon

## 2023-05-22 ENCOUNTER — Encounter: Payer: Self-pay | Admitting: Certified Nurse Midwife

## 2023-05-24 ENCOUNTER — Ambulatory Visit (INDEPENDENT_AMBULATORY_CARE_PROVIDER_SITE_OTHER): Payer: Commercial Managed Care - PPO | Admitting: Obstetrics

## 2023-05-24 ENCOUNTER — Encounter: Payer: Self-pay | Admitting: Obstetrics

## 2023-05-24 ENCOUNTER — Other Ambulatory Visit (HOSPITAL_COMMUNITY)
Admission: RE | Admit: 2023-05-24 | Discharge: 2023-05-24 | Disposition: A | Discharge status: Home / Self Care | Payer: Medicaid Other | Source: Ambulatory Visit | Attending: Obstetrics | Admitting: Obstetrics

## 2023-05-24 VITALS — BP 107/74 | HR 67 | Ht 65.0 in | Wt 143.0 lb

## 2023-05-24 DIAGNOSIS — Z113 Encounter for screening for infections with a predominantly sexual mode of transmission: Secondary | ICD-10-CM | POA: Diagnosis present

## 2023-05-24 NOTE — Progress Notes (Signed)
   GYN ENCOUNTER  Subjective  HPI: Stephanie May is a 27 y.o. [id number] who presents today for evaluation of a labial lesion. She reports that she was tired all last week. On Tuesday night, she began having vaginal itching. When she woke up in th morning, she noticed what looked like "little rips," and it was burning. It has continued itching and burning. She denies discharge and pelvic pain. She reports that she began dating a new partner about 2 months ago, and he has not had STI testing. She has an IUD for contraception.  Past Medical History:  Diagnosis Date   Chlamydia    History of chlamydia infection 2 9 16    Irregular menses    Medical history non-contributory    PCOS (polycystic ovarian syndrome)    Pelvic pain in female    Primary dysmenorrhea    Sexually transmitted disease (STD)    previous h/o   Unexplained weight gain    Past Surgical History:  Procedure Laterality Date   NO PAST SURGERIES     OB History     Gravida  2   Para  2   Term  2   Preterm      AB      Living  2      SAB      IAB      Ectopic      Multiple  0   Live Births  2          Allergies  Allergen Reactions   Gluten Meal    Lactose Intolerance (Gi)    Macrobid [Nitrofurantoin Macrocrystal]    Sulfa Antibiotics     ROS See HPI  Objective  BP 107/74   Pulse 67   Ht 5\' 5"  (1.651 m)   Wt 143 lb (64.9 kg)   BMI 23.80 kg/m   Physical examination   Pelvic:   Vulva: Normal appearance.  No lesions.  Vagina: No lesions or abnormalities noted.  Support: Normal pelvic support.  Urethra No masses tenderness or scarring.  Meatus Normal size without lesions or prolapse.  Cervix: Normal appearance.  No lesions. Mildly erythematous with  moderate amount of white discharge. Swab collected  Anus: Normal exam.  No lesions.  Perineum: Normal exam.  No lesions.    Assessment  STI screening Lesions have now healed - possible mild HSV infection vs labial  irritation  Plan  STI swabs and blood work collected, including HSV cervical swab and IgG F/u based on results  Guadlupe Spanish, CNM

## 2023-05-28 ENCOUNTER — Encounter: Payer: Self-pay | Admitting: Obstetrics

## 2023-05-28 ENCOUNTER — Other Ambulatory Visit: Payer: Self-pay | Admitting: Obstetrics

## 2023-05-28 MED ORDER — FLUCONAZOLE 150 MG PO TABS: 150.0000 mg | ORAL_TABLET | Freq: Once | ORAL | 0 refills | Status: AC

## 2023-05-28 NOTE — Progress Notes (Signed)
+  yeast. Diflucan 150 mg PO sent to pharmacy. Allene notified via MyChart.  Glenetta Borg, CNM

## 2023-07-11 ENCOUNTER — Ambulatory Visit: Payer: Medicaid Other | Admitting: Certified Nurse Midwife

## 2023-07-25 ENCOUNTER — Ambulatory Visit: Payer: Medicaid Other | Admitting: Certified Nurse Midwife

## 2023-07-25 ENCOUNTER — Encounter: Payer: Self-pay | Admitting: Certified Nurse Midwife

## 2023-07-25 VITALS — BP 119/73 | HR 59 | Wt 143.5 lb

## 2023-07-25 DIAGNOSIS — Z30432 Encounter for removal of intrauterine contraceptive device: Secondary | ICD-10-CM | POA: Diagnosis not present

## 2023-07-25 NOTE — Patient Instructions (Signed)

## 2023-07-25 NOTE — Progress Notes (Signed)
   GYNECOLOGY OFFICE PROCEDURE NOTE  Stephanie May is a 27 y.o. [phone] here for Mirena IUD removal. No GYN concerns.  Last pap smear was on 01/15/2023 and was normal.  IUD Removal  Patient identified, informed consent performed, consent signed.   Chaperone present.  Patient was placed in the dorsal lithotomy position, normal external genitalia was noted.  A speculum was placed in the patient's vagina, normal discharge was noted, no lesions. The cervix was visualized, no lesions, no abnormal discharge.  The strings of the IUD were grasped and pulled using ring forceps. The IUD was removed in its entirety.  Patient tolerated the procedure well.    Patient not sexually active, declines birth control.   Routine preventative health maintenance measures emphasized.   Doreene Burke, CNM

## 2023-11-20 ENCOUNTER — Encounter: Payer: Self-pay | Admitting: Certified Nurse Midwife

## 2023-11-20 ENCOUNTER — Other Ambulatory Visit: Payer: Self-pay | Admitting: Certified Nurse Midwife

## 2023-11-20 DIAGNOSIS — L732 Hidradenitis suppurativa: Secondary | ICD-10-CM

## 2024-04-10 ENCOUNTER — Other Ambulatory Visit (HOSPITAL_COMMUNITY)
Admission: RE | Admit: 2024-04-10 | Discharge: 2024-04-10 | Disposition: A | Discharge status: Home / Self Care | Source: Ambulatory Visit | Attending: Certified Nurse Midwife | Admitting: Certified Nurse Midwife

## 2024-04-10 ENCOUNTER — Ambulatory Visit

## 2024-04-10 VITALS — BP 125/79 | HR 77 | Ht 66.0 in | Wt 150.0 lb

## 2024-04-10 DIAGNOSIS — Z113 Encounter for screening for infections with a predominantly sexual mode of transmission: Secondary | ICD-10-CM | POA: Insufficient documentation

## 2024-04-10 NOTE — Progress Notes (Signed)
    NURSE VISIT NOTE  Subjective:    Patient ID: Stephanie May, female    DOB: 1995-11-28, 28 y.o.   MRN: [id number]  HPI  Patient is a 28 y.o. G55P2002 female who presents today for STD testing via self swab culture. Self swab culture preformed. All questions asked, patient aware office will be in touch with results   Objective:    BP 125/79   Pulse 77   Ht 5' 6 (1.676 m)   Wt 150 lb (68 kg)   LMP 03/15/2024   BMI 24.21 kg/m      Assessment:   1. Screening for STD (sexually transmitted disease)      Plan:   GC and chlamydia DNA  probe sent to lab. Treatment: await results for further treatment.  ROV prn if symptoms persist or worsen.   Vale Garrison, CMA

## 2024-04-13 LAB — CERVICOVAGINAL ANCILLARY ONLY
Bacterial Vaginitis (gardnerella): NEGATIVE
Candida Glabrata: NEGATIVE
Candida Vaginitis: NEGATIVE
Chlamydia: NEGATIVE
Comment: NEGATIVE
Comment: NEGATIVE
Comment: NEGATIVE
Comment: NEGATIVE
Comment: NEGATIVE
Comment: NORMAL
Neisseria Gonorrhea: NEGATIVE
Trichomonas: NEGATIVE

## 2024-04-15 ENCOUNTER — Ambulatory Visit: Payer: Self-pay

## 2024-07-22 ENCOUNTER — Other Ambulatory Visit (HOSPITAL_COMMUNITY)
Admission: RE | Admit: 2024-07-22 | Discharge: 2024-07-22 | Disposition: A | Discharge status: Home / Self Care | Payer: Self-pay | Source: Ambulatory Visit | Attending: Obstetrics | Admitting: Obstetrics

## 2024-07-22 ENCOUNTER — Ambulatory Visit (INDEPENDENT_AMBULATORY_CARE_PROVIDER_SITE_OTHER): Payer: Self-pay

## 2024-07-22 VITALS — BP 129/86 | HR 72 | Ht 66.0 in | Wt 148.9 lb

## 2024-07-22 DIAGNOSIS — N898 Other specified noninflammatory disorders of vagina: Secondary | ICD-10-CM | POA: Insufficient documentation

## 2024-07-22 DIAGNOSIS — Z113 Encounter for screening for infections with a predominantly sexual mode of transmission: Secondary | ICD-10-CM

## 2024-07-22 NOTE — Progress Notes (Signed)
    NURSE VISIT NOTE  Subjective:    Patient ID: DAYAN KREIS, female    DOB: 03/22/1996, 28 y.o.   MRN: [id number]  HPI  Patient is a 28 y.o. G49P2002 female who presents for green vaginal discharge and itching for 4 day(s).Patient is unsure of known exposure to STD.   Objective:    BP 129/86   Pulse 72   Ht 5' 6 (1.676 m)   Wt 148 lb 14.4 oz (67.5 kg)   BMI 24.03 kg/m      Assessment:   No diagnosis found.    Plan:   GC and chlamydia DNA  probe sent to lab. Treatment: await results for further treatment ROV prn if symptoms persist or worsen.   Waddell JONELLE Maxim, CMA

## 2024-07-23 LAB — CERVICOVAGINAL ANCILLARY ONLY
Bacterial Vaginitis (gardnerella): NEGATIVE
Candida Glabrata: NEGATIVE
Candida Vaginitis: POSITIVE — AB
Chlamydia: NEGATIVE
Comment: NEGATIVE
Comment: NEGATIVE
Comment: NEGATIVE
Comment: NEGATIVE
Comment: NEGATIVE
Comment: NORMAL
Neisseria Gonorrhea: NEGATIVE
Trichomonas: NEGATIVE

## 2024-07-24 ENCOUNTER — Other Ambulatory Visit: Payer: Self-pay

## 2024-07-24 ENCOUNTER — Ambulatory Visit: Payer: Self-pay

## 2024-07-24 DIAGNOSIS — B379 Candidiasis, unspecified: Secondary | ICD-10-CM

## 2024-07-24 MED ORDER — FLUCONAZOLE 150 MG PO TABS: 150.0000 mg | ORAL_TABLET | Freq: Every day | ORAL | 0 refills | Status: AC

## 2024-07-24 NOTE — Progress Notes (Signed)
 Rx diflucan  sent for yeast. Notified via mychart.
